# Patient Record
Sex: Female | Born: 1950 | Race: White | Hispanic: No | Marital: Married | Smoking: Never smoker
Health system: Southern US, Community
[De-identification: ages and names within clinical notes are randomized; demographics above are authoritative.]

## PROBLEM LIST (undated history)

## (undated) DIAGNOSIS — M109 Gout, unspecified: Secondary | ICD-10-CM

## (undated) DIAGNOSIS — R112 Nausea with vomiting, unspecified: Secondary | ICD-10-CM

## (undated) DIAGNOSIS — I10 Essential (primary) hypertension: Secondary | ICD-10-CM

## (undated) DIAGNOSIS — M1990 Unspecified osteoarthritis, unspecified site: Secondary | ICD-10-CM

## (undated) DIAGNOSIS — Z9889 Other specified postprocedural states: Secondary | ICD-10-CM

## (undated) DIAGNOSIS — D649 Anemia, unspecified: Secondary | ICD-10-CM

## (undated) DIAGNOSIS — Z87442 Personal history of urinary calculi: Secondary | ICD-10-CM

## (undated) HISTORY — PX: APPENDECTOMY: SHX54

## (undated) HISTORY — PX: FINGER SURGERY: SHX640

## (undated) HISTORY — PX: REDUCTION MAMMAPLASTY: SUR839

## (undated) HISTORY — PX: CHOLECYSTECTOMY: SHX55

## (undated) HISTORY — PX: TOTAL KNEE ARTHROPLASTY: SHX125

---

## 2003-01-02 ENCOUNTER — Encounter: Admission: RE | Admit: 2003-01-02 | Discharge: 2003-01-02 | Payer: Self-pay | Admitting: Unknown Physician Specialty

## 2003-01-02 ENCOUNTER — Encounter: Payer: Self-pay | Admitting: Unknown Physician Specialty

## 2004-06-25 ENCOUNTER — Encounter: Admission: RE | Admit: 2004-06-25 | Discharge: 2004-06-25 | Payer: Self-pay | Admitting: Unknown Physician Specialty

## 2004-07-13 ENCOUNTER — Encounter: Admission: RE | Admit: 2004-07-13 | Discharge: 2004-07-13 | Payer: Self-pay | Admitting: Unknown Physician Specialty

## 2005-08-17 ENCOUNTER — Encounter: Admission: RE | Admit: 2005-08-17 | Discharge: 2005-08-17 | Payer: Self-pay | Admitting: Unknown Physician Specialty

## 2005-08-25 ENCOUNTER — Encounter: Admission: RE | Admit: 2005-08-25 | Discharge: 2005-08-25 | Payer: Self-pay | Admitting: Unknown Physician Specialty

## 2006-09-14 ENCOUNTER — Encounter: Admission: RE | Admit: 2006-09-14 | Discharge: 2006-09-14 | Payer: Self-pay | Admitting: *Deleted

## 2007-03-26 ENCOUNTER — Inpatient Hospital Stay (HOSPITAL_COMMUNITY): Admission: RE | Admit: 2007-03-26 | Discharge: 2007-03-29 | Payer: Self-pay | Admitting: Orthopedic Surgery

## 2007-11-08 ENCOUNTER — Encounter: Admission: RE | Admit: 2007-11-08 | Discharge: 2007-11-08 | Payer: Self-pay | Admitting: *Deleted

## 2008-11-10 ENCOUNTER — Encounter: Admission: RE | Admit: 2008-11-10 | Discharge: 2008-11-10 | Payer: Self-pay | Admitting: Unknown Physician Specialty

## 2009-11-16 ENCOUNTER — Encounter: Admission: RE | Admit: 2009-11-16 | Discharge: 2009-11-16 | Payer: Self-pay | Admitting: Family Medicine

## 2010-07-09 ENCOUNTER — Ambulatory Visit: Payer: Self-pay | Admitting: Cardiology

## 2011-01-05 ENCOUNTER — Encounter
Admission: RE | Admit: 2011-01-05 | Discharge: 2011-01-05 | Payer: Self-pay | Source: Home / Self Care | Attending: Family Medicine | Admitting: Family Medicine

## 2011-01-16 ENCOUNTER — Encounter: Payer: Self-pay | Admitting: Unknown Physician Specialty

## 2011-05-13 NOTE — Discharge Summary (Signed)
NAME:  Karen Tucker, HANNIG NO.:  [id number]   MEDICAL RECORD NO.:  [id number]          PATIENT TYPE:  INP   LOCATION:  1507                         FACILITY:  [facility]   PHYSICIAN:  Ollen Gross, M.D.    DATE OF BIRTH:  April 10, 1951   DATE OF ADMISSION:  03/26/2007  DATE OF DISCHARGE:  03/29/2007                               DISCHARGE SUMMARY   ADMITTING DIAGNOSES:  1. 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  2. Osteoarthritis of left knee.  3. Hypertension.  4. History of hypokalemia in the past.  5. History of renal calculi.   DISCHARGE DIAGNOSES:  1. Osteoarthritis right knee, status post right total knee      arthroplasty.  2. Mild postoperative blood loss anemia, did not require transfusion.  3. Postoperative hypokalemia acutely postoperatively, improved.  4. Mild hyponatremia.   The remaining discharge diagnoses are the same as admitting diagnoses.   PROCEDURE:  1. On March 26, 2007 a right total knee.  Surgeon Dr. Lequita Halt.      Assistant Avel Peace PA-C.  Anesthesia general, postop Marcaine      pain pump.   CONSULTATIONS:  None.   BRIEF HISTORY:  The patient is a 60 year old female with end-stage  arthritis of the right knee with progressive worsening pain and  dysfunction that failed nonoperative management and now presents for  total knee arthroplasty.   LABORATORY DATA:  Preop CBC with hemoglobin of 14.2, hematocrit of 41.7,  white cell count 8.0.  Postop hemoglobin down to 11.8, white count went  up to 12.0.  Hemoglobin drifted back down or came down a little bit  further.  Last noted was 10.8 with hematocrit of 30.6.  White count came  back down to a normal level of 10.5.  PT and PTT preop 12.5 and 27  respectively.  INR 0.9.  Serial pro times followed.  Last PT and INR  21.8 and 1.8.  Chem panel on admission all within normal limits with the  exception of elevated total bili of 1.9.  Serial BMPs were followed.  Sodium did  drop from 138 to 136, last noted at 134.  Potassium dropped  from 3.8 to 3.2 and back up to 3.5.  Remaining electrolytes remained  within normal limits.  Preop urinalysis negative.  Blood group type O  positive.   EKG dated March 19, 2007:  Normal sinus rhythm, normal EKG, confirmed  Dr. Dani Gobble.  Two-view chest March 19, 2007:  No active  cardiopulmonary disease.   HOSPITAL COURSE:  The patient was admitted to [facility],  tolerated the procedure well, later transferred to recovery room and  then the orthopedic floor.  Started on PCA and IM and p.o. analgesic for  pain control following surgery.  She had a rough night in the evening of  surgery but was doing a little bit better on the morning of day one.  Had some intermittent nausea up until earlier that morning.  Nausea was  improving.  It was probably coming from t PCA.  We discontinued that and  encouraged p.o.  meds.  She had decent urinary output.  Potassium was  down, so we added some potassium supplements.  Her blood pressure was  running a little high, but she had not taken her blood pressure  medications.  Those were resumed.  We did lower the dose a little bit  due to it being an ACE inhibitor.  We wanted to make sure that she had  excellent urinary output.  She started getting up with PT by day 2.  She  felt much better.  Nausea had improved.  Much improved clinical response  with the patient.  Dressing was changed.  Incision looked good.  Her  output looked excellent.  Sodium was 136.  Potassium started coming back  up.  Continued the potassium supplements.  Fluids and IVs were  discontinued.  She started getting up to ambulate short distances on the  room but by the following day she was doing much better and ambulating  greater than 100 feet.  She had been weaned over to p.o. meds.  Potassium was back up, although sodium was down a little bit.  Medications were changed.  She was switched over from Percocet  to  Vicodin.  She did better with that medication with no more nausea and  was discharged home.   DISCHARGE PLAN:  1. The patient was discharged home on March 29, 2007.  2. Discharge diagnoses:  Please see above.  3. Discharge medications:  Coumadin, Vicodin, Robaxin, Phenergan.  4. Followup in 2 weeks.  5. Activity:  She is weightbearing as tolerated.  Home health PT and      home health nursing per total knee protocol.  6. Disposition:  Home.  7. Diet:  Resume home diet.   CONDITION ON DISCHARGE:  Improved.      Alexzandrew L. Perkins, P.A.C.      Ollen Gross, M.D.  Electronically Signed    ALP/MEDQ  D:  04/27/2007  T:  04/27/2007  Job:  [id number]   cc:   Donzetta Sprung  Fax: [phone]

## 2011-05-13 NOTE — H&P (Signed)
NAME:  Karen Tucker, Karen Tucker NO.:  [id number]   MEDICAL RECORD NO.:  [id number]          PATIENT TYPE:  INP   LOCATION:  NA                           [facility]   PHYSICIAN:  Ollen Gross, M.D.    DATE OF BIRTH:  05/12/1951   DATE OF ADMISSION:  03/26/2007  DATE OF DISCHARGE:                              HISTORY & PHYSICAL   CHIEF COMPLAINT:  Right knee pain.   HISTORY OF PRESENT ILLNESS:  The patient is a 60 year old female with  long-standing history of problems related to her right knee that has  been progressively ongoing for several years now.  She has been treated  in the past by Dr. Chaney Malling with a knee arthroscopy.  She has also had  viscous supplementations.  She is seen in second opinion by Dr. Lequita Halt  and found to have end-stage medial compartment arthritis in the right  knee and also involvement in the patellofemoral compartment.  There is  narrowing on the left but she is not quite bone-on-bone.  It is felt she  has reached a point where she could benefit from undergoing total knee  replacement.  Risks and benefits have been discussed and she elects to  proceed with surgery.   ALLERGIES:  CODEINE CAUSES RASH.   INTOLERANCES:  MORPHINE CAUSES SICKNESS.   CURRENT MEDICATIONS:  Lisinopril 20 mg, Aleve, allopurinol 300 mg,  Relafen 750 mg, and potassium chloride tablets in the form of Micro-K 10  mEq.   PAST MEDICAL HISTORY:  History of hypokalemia currently on  supplementation, hypertension, history of renal calculi and arthritis.   PAST SURGICAL HISTORY:  Appendectomy, cholecystectomy, right finger  surgery, breast reduction surgery.   SOCIAL HISTORY:  Married.  Nonsmoker.  No alcohol.  Two children.  Her  husband Anouk Critzer will be assisting with care after surgery.   FAMILY HISTORY:  Father deceased age 46 with a history of heart disease,  hypertension and stroke.  Mother with a history of arthritis and  multiple myeloma.  Brother  with a history of brain tumor.   REVIEW OF SYSTEMS:  GENERAL:  No fevers, chills, night sweats.  NEURO:  No seizure, syncope, paralysis.  RESPIRATORY:  No shortness of breath,  productive cough or hemoptysis.  CARDIOVASCULAR:  No chest pain, angina,  orthopnea.  GI:  No nausea, vomiting, diarrhea, constipation.  GU:  No  dysuria, hematuria, discharge.  MUSCULOSKELETAL:  Right knee.   PHYSICAL EXAMINATION:  VITAL SIGNS:  Pulse 64, respirations 16, blood  pressure 110/70.  GENERAL:  A 60 year old white female well nourished, well developed,  short statured, no acute distress.  She is alert and oriented,  cooperative, pleasant.  She is accompanied by her husband.  HEENT:  Normocephalic, atraumatic.  Pupils are round and reactive.  Oropharynx clear.  EOMs intact.  NECK:  Supple.  CHEST:  Clear anterior/posterior chest walls.  HEART:  Regular rate and rhythm, no murmur, S1-S2 noted.  ABDOMEN:  Soft, nontender, bowel sounds present.  RECTAL/BREASTS/GENITALIA:  Not done not pertinent to present illness.  EXTREMITIES:  Right knee shows slight  varus malalignment deformity.  Range of motion 5-120, no effusion.  Left knee shows range of motion of  0-125 and tender more medial, mild crepitus, no effusion.   IMPRESSION:  1. Osteoarthritis right knee, end stage, bone-on-bone.  2. Osteoarthritis left knee.  3. Hypertension.  4. History of hypokalemia.  5. History of renal calculi.   PLAN:  The patient will be admitted to [facility] to undergo a  right total knee replacement arthroplasty.  Surgery will be performed by  Dr. Ollen Gross.      Alexzandrew L. Julien Girt, P.A.      Ollen Gross, M.D.  Electronically Signed    ALP/MEDQ  D:  03/25/2007  T:  03/25/2007  Job:  [id number]   cc:   Donzetta Sprung  Fax: [phone]

## 2011-05-13 NOTE — Op Note (Signed)
NAME:  Karen Tucker, PICA NO.:  [id number]   MEDICAL RECORD NO.:  [id number]          PATIENT TYPE:  INP   LOCATION:  0009                         FACILITY:  [facility]   PHYSICIAN:  Ollen Gross, M.D.    DATE OF BIRTH:  Feb 21, 1951   DATE OF PROCEDURE:  03/26/2007  DATE OF DISCHARGE:                               OPERATIVE REPORT   PREOP DIAGNOSIS:  Osteoarthritis right knee.   POSTOP DIAGNOSIS:  Osteoarthritis right knee.   PROCEDURE:  Right total knee arthroplasty.   SURGEON:  Ollen Gross, M.D.   ASSISTANT:  Avel Peace PA-C   ANESTHESIA:  General with postop Marcaine pain pump.   ESTIMATED BLOOD LOSS:  Minimal.   DRAINS:  Hemovac x1.   TOURNIQUET TIME:  41 minutes at 300 mmHg.   COMPLICATIONS:  None.   CONDITION:  Stable to recovery.   BRIEF CLINICAL NOTE:  Ms. Hamric is a 60 year old female end-stage  arthritis of the right knee with progressively worsening pain and  dysfunction.  She has failed nonoperative management including injection  and presents now for total knee arthroplasty.   PROCEDURE IN DETAIL:  After the successful administration of general  anesthetic a tourniquet was placed high on the right thigh and right  lower extremity prepped and draped in the usual sterile fashion.  Extremities were wrapped in Esmarch, the knee flexed, tourniquet  inflated to 300 mmHg.  A midline incision was made with a 10 blade  through subcutaneous tissue to the level of the extensor mechanism.  A  fresh blade was used to make a medial parapatellar arthrotomy.  Soft  tissue over the proximal medial tibia was subperiosteally elevated to  the joint line with the knife into the semimembranosus bursa with a Cobb  elevator.  Soft tissue laterally was elevated with attention being paid  to avoid the patellar tendon on the tibial tubercle.  The patella  subluxed laterally, knee flexed 90 degrees, ACL and PCL removed.  A  drill was used to create a starting hole  and the distal femur canal was  thoroughly irrigated.  A 5 degree right valgus alignment guide is placed  referencing off the posterior condyles, rotations marked and the block  pins removed 10 mm off the distal femur.  Distal femoral resection was  made with an oscillating saw.  A sizing block is placed, a size 3 is  most appropriate.  Rotation is marked off the epicondylar axis.  A size  three cutting block is placed and the anterior, posterior and chamfer  cuts made on the femur.   Tibia subluxed forward and the menisci removed.  Extramedullary tibial  alignment guides placed referencing proximally at the medial aspect of  the tibial tubercle and distally along the second metatarsal axis and  tibial crest.  Blocks pinned to remove 10 mm of the non deficient  lateral side.  Tibial resection is made with an oscillating saw.  A size  2.5 is the most appropriate tibial component and the proximal tibia is  prepared with the modular drill and keel punch for a size  2.5.  Femoral  preparation is completed with the intercondylar cut for the size 3.   A size 2.5 mobile bearing tibial trial, a size 3 posterior stabilized  femoral trial and a 10 mm posterior stabilized rotating platform insert  trial are placed.  With a 10 full extension is achieved with excellent  varus and valgus balance throughout full range of motion.  The patella  is then everted and thickness measured to be 22 mm.  Freehand resection  was taken to 13 mm, 35 template is placed, lug holes were drilled, trial  patella is placed and it tracks normally.  Osteophytes were then removed  off the posterior femur with the trial in place.  All trials were  removed and the cut bone surfaces prepared with pulsatile lavage.  Cement is mixed and once ready for implantation the size 2.5 mobile  bearing tibial tray, size 3 posterior stabilized femur and the 35  patella are cemented in place.  The patella was held the clamp.  The  trial  10-mm inserts were placed and the knee held in full extension and  all extruded cement removed.  Once the cement is fully hardened then the  permanent 10 mm posterior stabilized rotating platform insert is placed  into the tibial tray.  Wounds copiously irrigated with saline solution  and the extensor mechanism closed over a Hemovac drain with interrupted  #1 PDS.  Flexion against gravity is 135 degrees.  Tourniquet is released  for a total time of 41 minutes.  The subcu was closed with interrupted 2-  0 Vicryl and subcuticular with running 4-0 Monocryl.  Catheter for the  Marcaine pain pump is placed and the pump is initiated.  Hemovac is also  placed to suction.  The incision is then cleaned and dried and Steri-  Strips and a bulky sterile dressing applied.  She is then placed into a  knee immobilizer, awakened and transported to recovery in stable  condition.      Ollen Gross, M.D.  Electronically Signed     FA/MEDQ  D:  03/26/2007  T:  03/26/2007  Job:  [id number]

## 2012-01-19 ENCOUNTER — Other Ambulatory Visit: Payer: Self-pay | Admitting: Family Medicine

## 2012-01-19 DIAGNOSIS — Z1231 Encounter for screening mammogram for malignant neoplasm of breast: Secondary | ICD-10-CM

## 2012-01-30 ENCOUNTER — Ambulatory Visit
Admission: RE | Admit: 2012-01-30 | Discharge: 2012-01-30 | Disposition: A | Discharge status: Home / Self Care | Payer: 59 | Source: Ambulatory Visit | Attending: Family Medicine | Admitting: Family Medicine

## 2012-01-30 DIAGNOSIS — Z1231 Encounter for screening mammogram for malignant neoplasm of breast: Secondary | ICD-10-CM

## 2013-03-25 ENCOUNTER — Other Ambulatory Visit: Payer: Self-pay

## 2013-03-25 DIAGNOSIS — Z1231 Encounter for screening mammogram for malignant neoplasm of breast: Secondary | ICD-10-CM

## 2013-04-18 ENCOUNTER — Ambulatory Visit
Admission: RE | Admit: 2013-04-18 | Discharge: 2013-04-18 | Disposition: A | Discharge status: Home / Self Care | Payer: 59 | Source: Ambulatory Visit

## 2013-04-18 DIAGNOSIS — Z1231 Encounter for screening mammogram for malignant neoplasm of breast: Secondary | ICD-10-CM

## 2014-01-22 ENCOUNTER — Encounter (HOSPITAL_COMMUNITY): Payer: Self-pay | Admitting: Pharmacy Technician

## 2014-01-24 NOTE — Patient Instructions (Addendum)
Karen Tucker  01/24/2014   Your procedure is scheduled on:   01/29/2014  Report to [facility] at  11:30  AM.  Call this number if you have problems the morning of surgery: [phone]   Remember:   Do not eat food or drink liquids after midnight.   Take these medicines the morning of surgery with A SIP OF WATER: Lisinopril   Do not wear jewelry, make-up or nail polish.  Do not wear lotions, powders, or perfumes.   Do not shave 48 hours prior to surgery. Men may shave face and neck.  Do not bring valuables to the hospital.  [facility] is not responsible for any belongings or valuables.               Contacts, dentures or bridgework may not be worn into surgery.  For patients admitted to the hospital, discharge time is determined by your treatment team.               Patients discharged the day of surgery will not be allowed to drive home.  Special Instructions: N/A     Please read over the following fact sheets that you were given: Pain Booklet, Coughing and Deep Breathing, Surgical Site Infection Prevention, Anesthesia Post-op Instructions and Care and Recovery After Surgery   Lithotripsy for Kidney Stones Lithotripsy is a treatment that can sometimes help eliminate kidney stones and pain that they cause. A form of lithotripsy, also known as extracorporeal shock wave lithotripsy, is a nonsurgical procedure that helps your body rid itself of the kidney stone when it is too big to pass on its own. Extracorporeal shock wave lithotripsy is a method of crushing a kidney stone with shock waves. These shock waves pass through your body and are focused on your stone. They cause the kidney stones to crumble while still in the urinary tract. It is then easier for the smaller pieces of stone to pass in the urine. Lithotripsy usually takes about an hour. It is done in a hospital, a lithotripsy center, or a mobile unit. It usually does not require an overnight stay. Your health care provider  will instruct you on preparation for the procedure. Your health care provider will tell you what to expect afterward. LET [facility] CARE PROVIDER KNOW ABOUT:  Any allergies you have.  All medicines you are taking, including vitamins, herbs, eye drops, creams, and over-the-counter medicines.  Previous problems you or members of your family have had with the use of anesthetics.  Any blood disorders you have.  Previous surgeries you have had.  Medical conditions you have. RISKS AND COMPLICATIONS Generally, lithotripsy for kidney stones is a safe procedure. However, as with any procedure, complications can occur. Possible complications include:  Infection.  Bleeding of the kidney.  Bruising of the kidney or skin.  Obstruction of the ureter.  Failure of the stone to fragment. BEFORE THE PROCEDURE  Do not eat or drink for 6 8 hours prior to the procedure. You may, however, take the medications with a sip of water that your physician instructs you to take  Do not take aspirin or aspirin-containing products for 7 days prior to your procedure  Do not take nonsteroidal anti-inflammatory products for 7 days prior to your procedure PROCEDURE A stent (flexible tube with holes) may be placed in your ureter. The ureter is the tube that transports the urine from the kidneys to the bladder. Your health care provider may place  a stent before the procedure. This will help keep urine flowing from the kidney if the fragments of the stone block the ureter. You may have an IV tube placed in one of your veins to give you fluids and medicines. These medicines may help you relax or make you sleep. During the procedure, you will lie comfortably on a fluid-filled cushion or in a warm-water bath. After an X-ray or ultrasound exam to locate your stone, shock waves are aimed at the stone. If you are awake, you may feel a tapping sensation as the shock waves pass through your body. If large stone particles  remain after treatment, a second procedure may be necessary at a later date. For comfort during the test:  Relax as much as possible.  Try to remain still as much as possible.  Try to follow instructions to speed up the test.  Let your health care provider know if you are uncomfortable, anxious, or in pain. AFTER THE PROCEDURE  After surgery, you will be taken to the recovery area. A nurse will watch and check your progress. Once you're awake, stable, and taking fluids well, you will be allowed to go home as long as there are no problems. You will also be allowed to pass your urine before discharge.You may be given antibiotics to help prevent infection. You may also be prescribed pain medicine if needed. In a week or two, your health care provider may remove your stent, if you have one. You may first have an X-ray exam to check on how successful the fragmentation of your stone has been and how much of the stone has passed. Your health care provider will check to see whether or not stone particles remain. SEEK IMMEDIATE MEDICAL CARE IF:  You develop a fever or shaking chills.  Your pain is not relieved by medicine.  You feel sick to your stomach (nauseated) and you vomit.  You develop heavy bleeding.  You have difficulty urinating.  You start to pass your stent from your penis. Document Released: 12/09/2000 Document Revised: 10/02/2013 Document Reviewed: 06/27/2013 [facility] Patient Information 2014 [place]. PATIENT INSTRUCTIONS POST-ANESTHESIA  IMMEDIATELY FOLLOWING SURGERY:  Do not drive or operate machinery for the first twenty four hours after surgery.  Do not make any important decisions for twenty four hours after surgery or while taking narcotic pain medications or sedatives.  If you develop intractable nausea and vomiting or a severe headache please notify your doctor immediately.  FOLLOW-UP:  Please make an appointment with your surgeon as instructed. You do not need  to follow up with anesthesia unless specifically instructed to do so.  WOUND CARE INSTRUCTIONS (if applicable):  Keep a dry clean dressing on the anesthesia/puncture wound site if there is drainage.  Once the wound has quit draining you may leave it open to air.  Generally you should leave the bandage intact for twenty four hours unless there is drainage.  If the epidural site drains for more than 36-48 hours please call the anesthesia department.  QUESTIONS?:  Please feel free to call your physician or the hospital operator if you have any questions, and they will be happy to assist you.

## 2014-01-27 ENCOUNTER — Encounter (HOSPITAL_COMMUNITY): Payer: Self-pay

## 2014-01-27 ENCOUNTER — Encounter (HOSPITAL_COMMUNITY)
Admission: RE | Admit: 2014-01-27 | Discharge: 2014-01-27 | Disposition: A | Discharge status: Home / Self Care | Payer: 59 | Source: Ambulatory Visit | Attending: Urology | Admitting: Urology

## 2014-01-27 HISTORY — DX: Gout, unspecified: M10.9

## 2014-01-27 HISTORY — DX: Essential (primary) hypertension: I10

## 2014-01-27 LAB — BASIC METABOLIC PANEL
BUN: 15 mg/dL (ref 6–23)
CALCIUM: 9.6 mg/dL (ref 8.4–10.5)
CO2: 27 mEq/L (ref 19–32)
Chloride: 101 mEq/L (ref 96–112)
Creatinine, Ser: 0.72 mg/dL (ref 0.50–1.10)
GFR calc Af Amer: >90 mL/min (ref >90)
GFR, EST NON AFRICAN AMERICAN: 89 mL/min — AB (ref >90)
GLUCOSE: 100 mg/dL — AB (ref 70–99)
POTASSIUM: 3.9 meq/L (ref 3.7–5.3)
Sodium: 142 mEq/L (ref 137–147)

## 2014-01-28 NOTE — H&P (Signed)
Urology Admission H&P  Chief Complaint:lt flank pain; CT showed no obstruction,no obstruction; 7-8 MM size stone left kidney; History of kidney stones in the past and has had stone basket extraction. CT also shows colitis on the Left side; she has C-diff positive stools. She was treated with Flagyl and sent home. The pain has subsided. I have advised her to undergo ESL-left kidney stone because stone is quite big and probably will not be Able to pass on her own. Procedure,complications and possible additional procedures have been discussed with patient.  History of Present Illness: Left flank pain; most likely coming from colitis. Has large stone left kidney.  Past Medical History  Diagnosis Date  . Gout   . Hypertension    Past Surgical History  Procedure Laterality Date  . Cholecystectomy    . Appendectomy    . Total knee arthroplasty Right   . Finger surgery Left     due to major cut    Home Medications:  No prescriptions prior to admission   Allergies:  Allergies  Allergen Reactions  . Codeine Nausea And Vomiting  . Morphine And Related Nausea And Vomiting    No family history on file. Social History:  reports that she has never smoked. She does not have any smokeless tobacco history on file. She reports that she does not drink alcohol or use illicit drugs.  Review of Systems  Genitourinary: Positive for flank pain.    Physical Exam:  Vital signs in last 24 hours:   Physical Exam  Constitutional: She appears well-developed.  Cardiovascular: Normal rate.   GI: There is tenderness.  Musculoskeletal: Normal range of motion.    Laboratory Data:  No results found for this or any previous visit (from the past 24 hour(s)). No results found for this or any previous visit (from the past 240 hour(s)). Creatinine:  Recent Labs  01/27/14 1240  CREATININE 0.72   Baseline Creatinine: 0.72-performed 12/27/13  Impression/Assessment:  Left renal  calculus,large.  Plan:  ESL lt renal calculus.  Karen Tucker,MOHAMMAD I 01/28/2014, 3:28 PM

## 2014-01-29 ENCOUNTER — Ambulatory Visit (HOSPITAL_COMMUNITY): Payer: 59

## 2014-01-29 ENCOUNTER — Ambulatory Visit (HOSPITAL_COMMUNITY)
Admission: RE | Admit: 2014-01-29 | Discharge: 2014-01-29 | Disposition: A | Discharge status: Home / Self Care | Payer: 59 | Source: Ambulatory Visit | Attending: Urology | Admitting: Urology

## 2014-01-29 ENCOUNTER — Encounter (HOSPITAL_COMMUNITY): Payer: Self-pay | Admitting: *Deleted

## 2014-01-29 ENCOUNTER — Encounter (HOSPITAL_COMMUNITY)
Admission: RE | Disposition: A | Discharge status: Home / Self Care | Payer: Self-pay | Source: Ambulatory Visit | Attending: Urology

## 2014-01-29 DIAGNOSIS — Z01810 Encounter for preprocedural cardiovascular examination: Secondary | ICD-10-CM | POA: Insufficient documentation

## 2014-01-29 DIAGNOSIS — N200 Calculus of kidney: Secondary | ICD-10-CM | POA: Insufficient documentation

## 2014-01-29 DIAGNOSIS — I10 Essential (primary) hypertension: Secondary | ICD-10-CM | POA: Insufficient documentation

## 2014-01-29 HISTORY — PX: EXTRACORPOREAL SHOCK WAVE LITHOTRIPSY: SHX1557

## 2014-01-29 SURGERY — LITHOTRIPSY, ESWL
Anesthesia: Moderate Sedation | Laterality: Left

## 2014-01-29 MED ORDER — DIPHENHYDRAMINE HCL 25 MG PO CAPS
ORAL_CAPSULE | ORAL | Status: AC
  Filled 2014-01-29: qty 1

## 2014-01-29 MED ORDER — DIAZEPAM 5 MG PO TABS
ORAL_TABLET | ORAL | Status: AC
  Filled 2014-01-29: qty 1

## 2014-01-29 MED ORDER — DIAZEPAM 5 MG PO TABS
10.0000 mg | ORAL_TABLET | Freq: Once | ORAL | Status: AC
  Administered 2014-01-29: 10 mg via ORAL

## 2014-01-29 MED ORDER — DIPHENHYDRAMINE HCL 25 MG PO CAPS
25.0000 mg | ORAL_CAPSULE | Freq: Once | ORAL | Status: AC
  Administered 2014-01-29: 25 mg via ORAL

## 2014-01-29 MED ORDER — SODIUM CHLORIDE 0.9 % IV SOLN
INTRAVENOUS | Status: DC
  Administered 2014-01-29: 12:00:00 via INTRAVENOUS

## 2014-01-29 SURGICAL SUPPLY — 3 items
CLOTH BEACON ORANGE TIMEOUT ST (SAFETY) IMPLANT
GOWN STRL REUS W/TWL LRG LVL3 (GOWN DISPOSABLE) IMPLANT
TOWEL OR 17X26 4PK STRL BLUE (TOWEL DISPOSABLE) IMPLANT

## 2014-01-29 NOTE — Discharge Instructions (Signed)
Lithotripsy, Care After °Refer to this sheet in the next few weeks. These instructions provide you with information on caring for yourself after your procedure. Your health care provider may also give you more specific instructions. Your treatment has been planned according to current medical practices, but problems sometimes occur. Call your health care provider if you have any problems or questions after your procedure. °WHAT TO EXPECT AFTER THE PROCEDURE  °· Your urine may have a red tinge for a few days after treatment. Blood loss is usually minimal. °· You may have soreness in the back or flank area. This usually goes away after a few days. The procedure can cause blotches or bruises on the back where the pressure wave enters the skin. These marks usually cause only minimal discomfort and should disappear in a short time. °· Stone fragments should begin to pass within 24 hours of treatment. However, a delayed passage is not unusual. °· You may have pain, discomfort, and feel sick to your stomach (nauseated) when the crushed fragments of stone are passed down the tube from the kidney to the bladder. Stone fragments can pass soon after the procedure and may last for up to 4 8 weeks. °· A small number of patients may have severe pain when stone fragments are not able to pass, which leads to an obstruction. °· If your stone is greater than 1 inch (2.5 cm) in diameter or if you have multiple stones that have a combined diameter greater than 1 inch (2.5 cm), you may require more than one treatment. °· If you had a stent placed prior to your procedure, you may experience some discomfort, especially during urination. You may experience the pain or discomfort in your flank or back, or you may experience a sharp pain or discomfort at the base of your penis or in your lower abdomen. The discomfort usually lasts only a few minutes after urinating. °HOME CARE INSTRUCTIONS  °· Rest at home until you feel your energy  improving. °· Only take over-the-counter or prescription medicines for pain, discomfort, or fever as directed by your health care provider. Depending on the type of lithotripsy, you may need to take antibiotics and anti-inflammatory medicines for a few days. °· Drink enough water and fluids to keep your urine clear or pale yellow. This helps "flush" your kidneys. It helps pass any remaining pieces of stone and prevents stones from coming back. °· Most people can resume daily activities within 1 2 days after standard lithotripsy. It can take longer to recover from laser and percutaneous lithotripsy. °· If the stones are in your urinary system, you may be asked to strain your urine at home to look for stones. Any stones that are found can be sent to a medical lab for examination. °· Visit your health care provider for a follow-up appointment in a few weeks. Your doctor may remove your stent if you have one. Your health care provider will also check to see whether stone particles still remain. °SEEK MEDICAL CARE IF:  °· Your pain is not relieved by medicine. °· You have a lasting nauseous feeling. °· You feel there is too much blood in the urine. °· You develop persistent problems with frequent or painful urination that does not at least partially improve after 2 days following the procedure. °· You have a congested cough. °· You feel lightheaded. °· You develop a rash or any other signs that might suggest an allergic problem. °· You develop any reaction or side   effects to your medicine(s). °SEEK IMMEDIATE MEDICAL CARE IF:  °· You experience severe back or flank pain or both. °· You see nothing but blood when you urinate. °· You cannot pass any urine at all. °· You have a fever or shaking chills. °· You develop shortness of breath, difficulty breathing, or chest pain. °· You develop vomiting that will not stop after 6 8 hours. °· You have a fainting episode. °Document Released: 01/01/2008 Document Revised: 10/02/2013  Document Reviewed: 06/27/2013 °ExitCare® Patient Information ©2014 [employer]. ° °

## 2014-01-30 ENCOUNTER — Encounter (HOSPITAL_COMMUNITY): Payer: Self-pay | Admitting: Urology

## 2014-01-30 MED ORDER — SIMETHICONE 40 MG/0.6ML PO SUSP
ORAL | Status: AC
  Filled 2014-01-30: qty 1.8

## 2014-02-03 MED FILL — Ondansetron HCl Inj 4 MG/2ML (2 MG/ML): INTRAMUSCULAR | Qty: 2 | Status: AC

## 2014-02-03 MED FILL — Midazolam HCl Inj 2 MG/2ML (Base Equivalent): INTRAMUSCULAR | Qty: 6 | Status: AC

## 2014-02-03 MED FILL — Fentanyl Citrate Inj 0.05 MG/ML: INTRAMUSCULAR | Qty: 6 | Status: AC

## 2014-02-05 ENCOUNTER — Ambulatory Visit (HOSPITAL_COMMUNITY)
Admission: RE | Admit: 2014-02-05 | Discharge: 2014-02-05 | Disposition: A | Discharge status: Home / Self Care | Payer: 59 | Source: Ambulatory Visit | Attending: Urology | Admitting: Urology

## 2014-02-05 ENCOUNTER — Other Ambulatory Visit (HOSPITAL_COMMUNITY): Payer: Self-pay | Admitting: Urology

## 2014-02-05 DIAGNOSIS — M47817 Spondylosis without myelopathy or radiculopathy, lumbosacral region: Secondary | ICD-10-CM | POA: Insufficient documentation

## 2014-02-05 DIAGNOSIS — N200 Calculus of kidney: Secondary | ICD-10-CM

## 2014-04-02 ENCOUNTER — Other Ambulatory Visit: Payer: Self-pay

## 2014-04-02 DIAGNOSIS — Z1231 Encounter for screening mammogram for malignant neoplasm of breast: Secondary | ICD-10-CM

## 2014-04-21 ENCOUNTER — Ambulatory Visit
Admission: RE | Admit: 2014-04-21 | Discharge: 2014-04-21 | Disposition: A | Discharge status: Home / Self Care | Payer: 59 | Source: Ambulatory Visit

## 2014-04-21 DIAGNOSIS — Z1231 Encounter for screening mammogram for malignant neoplasm of breast: Secondary | ICD-10-CM

## 2014-08-08 DIAGNOSIS — I10 Essential (primary) hypertension: Secondary | ICD-10-CM | POA: Diagnosis not present

## 2014-08-08 DIAGNOSIS — E782 Mixed hyperlipidemia: Secondary | ICD-10-CM | POA: Diagnosis not present

## 2014-08-08 DIAGNOSIS — E876 Hypokalemia: Secondary | ICD-10-CM | POA: Diagnosis not present

## 2014-08-08 DIAGNOSIS — IMO0001 Reserved for inherently not codable concepts without codable children: Secondary | ICD-10-CM | POA: Diagnosis not present

## 2014-08-08 DIAGNOSIS — K7689 Other specified diseases of liver: Secondary | ICD-10-CM | POA: Diagnosis not present

## 2014-08-08 DIAGNOSIS — R7309 Other abnormal glucose: Secondary | ICD-10-CM | POA: Diagnosis not present

## 2014-08-18 DIAGNOSIS — R7309 Other abnormal glucose: Secondary | ICD-10-CM | POA: Diagnosis not present

## 2014-08-18 DIAGNOSIS — E669 Obesity, unspecified: Secondary | ICD-10-CM | POA: Diagnosis not present

## 2014-08-18 DIAGNOSIS — E782 Mixed hyperlipidemia: Secondary | ICD-10-CM | POA: Diagnosis not present

## 2014-08-18 DIAGNOSIS — Z23 Encounter for immunization: Secondary | ICD-10-CM | POA: Diagnosis not present

## 2014-08-18 DIAGNOSIS — M1990 Unspecified osteoarthritis, unspecified site: Secondary | ICD-10-CM | POA: Diagnosis not present

## 2014-08-18 DIAGNOSIS — M109 Gout, unspecified: Secondary | ICD-10-CM | POA: Diagnosis not present

## 2014-08-18 DIAGNOSIS — E876 Hypokalemia: Secondary | ICD-10-CM | POA: Diagnosis not present

## 2014-08-18 DIAGNOSIS — I10 Essential (primary) hypertension: Secondary | ICD-10-CM | POA: Diagnosis not present

## 2014-09-05 DIAGNOSIS — K5730 Diverticulosis of large intestine without perforation or abscess without bleeding: Secondary | ICD-10-CM | POA: Diagnosis not present

## 2014-09-05 DIAGNOSIS — K529 Noninfective gastroenteritis and colitis, unspecified: Secondary | ICD-10-CM | POA: Diagnosis not present

## 2014-09-05 DIAGNOSIS — Z86010 Personal history of colonic polyps: Secondary | ICD-10-CM | POA: Diagnosis not present

## 2014-09-08 DIAGNOSIS — Z86010 Personal history of colonic polyps: Secondary | ICD-10-CM | POA: Diagnosis not present

## 2014-09-08 DIAGNOSIS — Z1211 Encounter for screening for malignant neoplasm of colon: Secondary | ICD-10-CM | POA: Diagnosis not present

## 2014-09-08 DIAGNOSIS — K5730 Diverticulosis of large intestine without perforation or abscess without bleeding: Secondary | ICD-10-CM | POA: Diagnosis not present

## 2014-09-08 DIAGNOSIS — R197 Diarrhea, unspecified: Secondary | ICD-10-CM | POA: Diagnosis not present

## 2014-09-08 DIAGNOSIS — Z09 Encounter for follow-up examination after completed treatment for conditions other than malignant neoplasm: Secondary | ICD-10-CM | POA: Diagnosis not present

## 2014-09-08 DIAGNOSIS — D126 Benign neoplasm of colon, unspecified: Secondary | ICD-10-CM | POA: Diagnosis not present

## 2014-10-28 DIAGNOSIS — H52223 Regular astigmatism, bilateral: Secondary | ICD-10-CM | POA: Diagnosis not present

## 2014-10-28 DIAGNOSIS — H5203 Hypermetropia, bilateral: Secondary | ICD-10-CM | POA: Diagnosis not present

## 2014-10-28 DIAGNOSIS — H2513 Age-related nuclear cataract, bilateral: Secondary | ICD-10-CM | POA: Diagnosis not present

## 2014-10-28 DIAGNOSIS — H3530 Unspecified macular degeneration: Secondary | ICD-10-CM | POA: Diagnosis not present

## 2015-01-03 DIAGNOSIS — R109 Unspecified abdominal pain: Secondary | ICD-10-CM | POA: Diagnosis not present

## 2015-01-03 DIAGNOSIS — Z87442 Personal history of urinary calculi: Secondary | ICD-10-CM | POA: Diagnosis not present

## 2015-01-03 DIAGNOSIS — Z79899 Other long term (current) drug therapy: Secondary | ICD-10-CM | POA: Diagnosis not present

## 2015-01-03 DIAGNOSIS — R1032 Left lower quadrant pain: Secondary | ICD-10-CM | POA: Diagnosis not present

## 2015-01-03 DIAGNOSIS — Z888 Allergy status to other drugs, medicaments and biological substances status: Secondary | ICD-10-CM | POA: Diagnosis not present

## 2015-01-03 DIAGNOSIS — I10 Essential (primary) hypertension: Secondary | ICD-10-CM | POA: Diagnosis not present

## 2015-01-03 DIAGNOSIS — N132 Hydronephrosis with renal and ureteral calculous obstruction: Secondary | ICD-10-CM | POA: Diagnosis not present

## 2015-01-03 DIAGNOSIS — N202 Calculus of kidney with calculus of ureter: Secondary | ICD-10-CM | POA: Diagnosis not present

## 2015-01-03 DIAGNOSIS — Z8249 Family history of ischemic heart disease and other diseases of the circulatory system: Secondary | ICD-10-CM | POA: Diagnosis not present

## 2015-01-03 DIAGNOSIS — N200 Calculus of kidney: Secondary | ICD-10-CM | POA: Diagnosis not present

## 2015-01-08 DIAGNOSIS — R05 Cough: Secondary | ICD-10-CM | POA: Diagnosis not present

## 2015-01-08 DIAGNOSIS — N200 Calculus of kidney: Secondary | ICD-10-CM | POA: Diagnosis not present

## 2015-02-24 IMAGING — CR DG ABDOMEN 1V
1 series · 1 of 1 positions shown · non-contrast
Comparison: 01/02/2014

CLINICAL DATA: Renal calculi

EXAM:
ABDOMEN - 1 VIEW

[view not recorded]
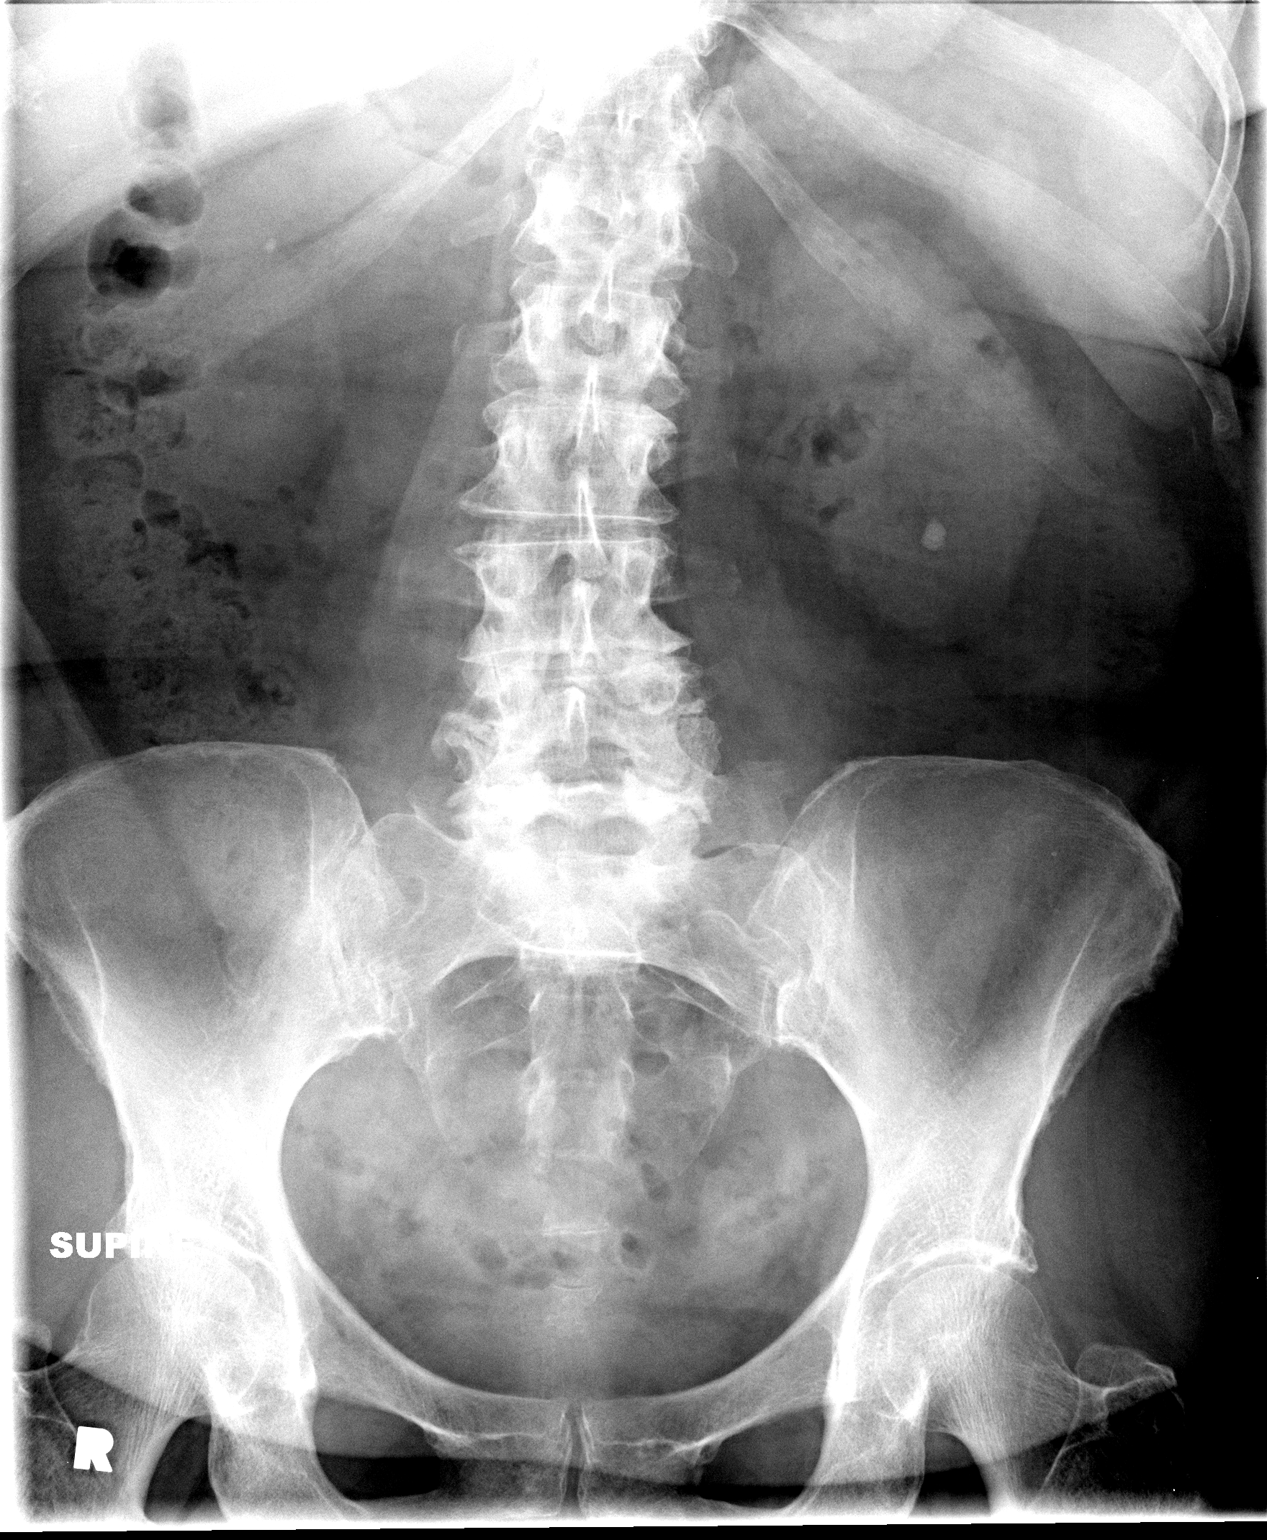

[1 of 1 positions shown; findings below may reference images not displayed]

FINDINGS: Bilateral renal calculi are again identified and stable. No
definitive ureteral stones are seen. Degenerative change of the
lumbar spine is again noted.
IMPRESSION: Stable bilateral renal calculi.

## 2015-03-02 DIAGNOSIS — M791 Myalgia: Secondary | ICD-10-CM | POA: Diagnosis not present

## 2015-03-02 DIAGNOSIS — E8881 Metabolic syndrome: Secondary | ICD-10-CM | POA: Diagnosis not present

## 2015-03-02 DIAGNOSIS — E6609 Other obesity due to excess calories: Secondary | ICD-10-CM | POA: Diagnosis not present

## 2015-03-04 DIAGNOSIS — M1712 Unilateral primary osteoarthritis, left knee: Secondary | ICD-10-CM | POA: Diagnosis not present

## 2015-03-30 DIAGNOSIS — I10 Essential (primary) hypertension: Secondary | ICD-10-CM | POA: Diagnosis not present

## 2015-03-30 DIAGNOSIS — E8881 Metabolic syndrome: Secondary | ICD-10-CM | POA: Diagnosis not present

## 2015-03-30 DIAGNOSIS — K7581 Nonalcoholic steatohepatitis (NASH): Secondary | ICD-10-CM | POA: Diagnosis not present

## 2015-03-30 DIAGNOSIS — M170 Bilateral primary osteoarthritis of knee: Secondary | ICD-10-CM | POA: Diagnosis not present

## 2015-03-30 DIAGNOSIS — E782 Mixed hyperlipidemia: Secondary | ICD-10-CM | POA: Diagnosis not present

## 2015-03-30 DIAGNOSIS — Z9189 Other specified personal risk factors, not elsewhere classified: Secondary | ICD-10-CM | POA: Diagnosis not present

## 2015-03-30 DIAGNOSIS — E6609 Other obesity due to excess calories: Secondary | ICD-10-CM | POA: Diagnosis not present

## 2015-03-30 DIAGNOSIS — M1000 Idiopathic gout, unspecified site: Secondary | ICD-10-CM | POA: Diagnosis not present

## 2015-03-30 DIAGNOSIS — E876 Hypokalemia: Secondary | ICD-10-CM | POA: Diagnosis not present

## 2015-03-30 DIAGNOSIS — Z0001 Encounter for general adult medical examination with abnormal findings: Secondary | ICD-10-CM | POA: Diagnosis not present

## 2015-03-30 DIAGNOSIS — Z1389 Encounter for screening for other disorder: Secondary | ICD-10-CM | POA: Diagnosis not present

## 2015-07-27 ENCOUNTER — Other Ambulatory Visit: Payer: Self-pay

## 2015-07-27 DIAGNOSIS — Z1231 Encounter for screening mammogram for malignant neoplasm of breast: Secondary | ICD-10-CM

## 2015-08-06 ENCOUNTER — Ambulatory Visit
Admission: RE | Admit: 2015-08-06 | Discharge: 2015-08-06 | Disposition: A | Discharge status: Home / Self Care | Payer: Medicare Other | Source: Ambulatory Visit

## 2015-08-06 DIAGNOSIS — Z1231 Encounter for screening mammogram for malignant neoplasm of breast: Secondary | ICD-10-CM

## 2015-09-07 DIAGNOSIS — R5383 Other fatigue: Secondary | ICD-10-CM | POA: Diagnosis not present

## 2015-09-07 DIAGNOSIS — M25511 Pain in right shoulder: Secondary | ICD-10-CM | POA: Diagnosis not present

## 2015-11-04 DIAGNOSIS — E782 Mixed hyperlipidemia: Secondary | ICD-10-CM | POA: Diagnosis not present

## 2015-11-04 DIAGNOSIS — K7581 Nonalcoholic steatohepatitis (NASH): Secondary | ICD-10-CM | POA: Diagnosis not present

## 2015-11-04 DIAGNOSIS — M170 Bilateral primary osteoarthritis of knee: Secondary | ICD-10-CM | POA: Diagnosis not present

## 2015-11-04 DIAGNOSIS — E876 Hypokalemia: Secondary | ICD-10-CM | POA: Diagnosis not present

## 2015-11-04 DIAGNOSIS — E8881 Metabolic syndrome: Secondary | ICD-10-CM | POA: Diagnosis not present

## 2015-11-04 DIAGNOSIS — Z23 Encounter for immunization: Secondary | ICD-10-CM | POA: Diagnosis not present

## 2015-11-04 DIAGNOSIS — I10 Essential (primary) hypertension: Secondary | ICD-10-CM | POA: Diagnosis not present

## 2015-11-04 DIAGNOSIS — E6609 Other obesity due to excess calories: Secondary | ICD-10-CM | POA: Diagnosis not present

## 2015-11-18 DIAGNOSIS — Z6834 Body mass index (BMI) 34.0-34.9, adult: Secondary | ICD-10-CM | POA: Diagnosis not present

## 2015-11-18 DIAGNOSIS — Z01419 Encounter for gynecological examination (general) (routine) without abnormal findings: Secondary | ICD-10-CM | POA: Diagnosis not present

## 2015-12-30 DIAGNOSIS — H2513 Age-related nuclear cataract, bilateral: Secondary | ICD-10-CM | POA: Diagnosis not present

## 2015-12-30 DIAGNOSIS — H353131 Nonexudative age-related macular degeneration, bilateral, early dry stage: Secondary | ICD-10-CM | POA: Diagnosis not present

## 2015-12-30 DIAGNOSIS — H5203 Hypermetropia, bilateral: Secondary | ICD-10-CM | POA: Diagnosis not present

## 2015-12-30 DIAGNOSIS — H35373 Puckering of macula, bilateral: Secondary | ICD-10-CM | POA: Diagnosis not present

## 2016-03-08 DIAGNOSIS — M25562 Pain in left knee: Secondary | ICD-10-CM | POA: Diagnosis not present

## 2016-03-08 DIAGNOSIS — M1712 Unilateral primary osteoarthritis, left knee: Secondary | ICD-10-CM | POA: Diagnosis not present

## 2016-04-21 DIAGNOSIS — S63592A Other specified sprain of left wrist, initial encounter: Secondary | ICD-10-CM | POA: Diagnosis not present

## 2016-04-21 DIAGNOSIS — M170 Bilateral primary osteoarthritis of knee: Secondary | ICD-10-CM | POA: Diagnosis not present

## 2016-04-22 DIAGNOSIS — E782 Mixed hyperlipidemia: Secondary | ICD-10-CM | POA: Diagnosis not present

## 2016-04-22 DIAGNOSIS — K7581 Nonalcoholic steatohepatitis (NASH): Secondary | ICD-10-CM | POA: Diagnosis not present

## 2016-04-22 DIAGNOSIS — I10 Essential (primary) hypertension: Secondary | ICD-10-CM | POA: Diagnosis not present

## 2016-04-26 DIAGNOSIS — R7301 Impaired fasting glucose: Secondary | ICD-10-CM | POA: Diagnosis not present

## 2016-04-26 DIAGNOSIS — E876 Hypokalemia: Secondary | ICD-10-CM | POA: Diagnosis not present

## 2016-04-26 DIAGNOSIS — I10 Essential (primary) hypertension: Secondary | ICD-10-CM | POA: Diagnosis not present

## 2016-04-26 DIAGNOSIS — E8881 Metabolic syndrome: Secondary | ICD-10-CM | POA: Diagnosis not present

## 2016-04-26 DIAGNOSIS — E782 Mixed hyperlipidemia: Secondary | ICD-10-CM | POA: Diagnosis not present

## 2016-04-26 DIAGNOSIS — E6609 Other obesity due to excess calories: Secondary | ICD-10-CM | POA: Diagnosis not present

## 2016-04-26 DIAGNOSIS — M170 Bilateral primary osteoarthritis of knee: Secondary | ICD-10-CM | POA: Diagnosis not present

## 2016-04-26 DIAGNOSIS — K7581 Nonalcoholic steatohepatitis (NASH): Secondary | ICD-10-CM | POA: Diagnosis not present

## 2016-06-13 DIAGNOSIS — H11153 Pinguecula, bilateral: Secondary | ICD-10-CM | POA: Diagnosis not present

## 2016-06-13 DIAGNOSIS — H353111 Nonexudative age-related macular degeneration, right eye, early dry stage: Secondary | ICD-10-CM | POA: Diagnosis not present

## 2016-06-13 DIAGNOSIS — H35372 Puckering of macula, left eye: Secondary | ICD-10-CM | POA: Diagnosis not present

## 2016-06-13 DIAGNOSIS — H353121 Nonexudative age-related macular degeneration, left eye, early dry stage: Secondary | ICD-10-CM | POA: Diagnosis not present

## 2016-06-13 DIAGNOSIS — H25013 Cortical age-related cataract, bilateral: Secondary | ICD-10-CM | POA: Diagnosis not present

## 2016-06-13 DIAGNOSIS — H5203 Hypermetropia, bilateral: Secondary | ICD-10-CM | POA: Diagnosis not present

## 2016-06-13 DIAGNOSIS — H35371 Puckering of macula, right eye: Secondary | ICD-10-CM | POA: Diagnosis not present

## 2016-06-13 DIAGNOSIS — H11823 Conjunctivochalasis, bilateral: Secondary | ICD-10-CM | POA: Diagnosis not present

## 2016-06-13 DIAGNOSIS — H524 Presbyopia: Secondary | ICD-10-CM | POA: Diagnosis not present

## 2016-06-13 DIAGNOSIS — H2513 Age-related nuclear cataract, bilateral: Secondary | ICD-10-CM | POA: Diagnosis not present

## 2016-07-11 DIAGNOSIS — M1712 Unilateral primary osteoarthritis, left knee: Secondary | ICD-10-CM | POA: Diagnosis not present

## 2016-08-15 ENCOUNTER — Other Ambulatory Visit: Payer: Self-pay | Admitting: Family Medicine

## 2016-08-15 DIAGNOSIS — Z1231 Encounter for screening mammogram for malignant neoplasm of breast: Secondary | ICD-10-CM

## 2016-08-25 ENCOUNTER — Other Ambulatory Visit: Payer: Self-pay

## 2016-09-08 ENCOUNTER — Ambulatory Visit
Admission: RE | Admit: 2016-09-08 | Discharge: 2016-09-08 | Disposition: A | Discharge status: Home / Self Care | Payer: Medicare Other | Source: Ambulatory Visit | Attending: Family Medicine | Admitting: Family Medicine

## 2016-09-08 DIAGNOSIS — Z1231 Encounter for screening mammogram for malignant neoplasm of breast: Secondary | ICD-10-CM

## 2016-09-19 DIAGNOSIS — M19032 Primary osteoarthritis, left wrist: Secondary | ICD-10-CM | POA: Diagnosis not present

## 2016-09-19 DIAGNOSIS — S63592A Other specified sprain of left wrist, initial encounter: Secondary | ICD-10-CM | POA: Diagnosis not present

## 2016-09-19 DIAGNOSIS — M25532 Pain in left wrist: Secondary | ICD-10-CM | POA: Diagnosis not present

## 2016-09-19 DIAGNOSIS — R5383 Other fatigue: Secondary | ICD-10-CM | POA: Diagnosis not present

## 2016-09-19 DIAGNOSIS — Z6833 Body mass index (BMI) 33.0-33.9, adult: Secondary | ICD-10-CM | POA: Diagnosis not present

## 2016-09-19 DIAGNOSIS — E782 Mixed hyperlipidemia: Secondary | ICD-10-CM | POA: Diagnosis not present

## 2016-09-26 DIAGNOSIS — E876 Hypokalemia: Secondary | ICD-10-CM | POA: Diagnosis not present

## 2016-09-26 DIAGNOSIS — E6609 Other obesity due to excess calories: Secondary | ICD-10-CM | POA: Diagnosis not present

## 2016-09-26 DIAGNOSIS — E782 Mixed hyperlipidemia: Secondary | ICD-10-CM | POA: Diagnosis not present

## 2016-09-26 DIAGNOSIS — I10 Essential (primary) hypertension: Secondary | ICD-10-CM | POA: Diagnosis not present

## 2016-09-26 DIAGNOSIS — Z23 Encounter for immunization: Secondary | ICD-10-CM | POA: Diagnosis not present

## 2016-09-26 DIAGNOSIS — E8881 Metabolic syndrome: Secondary | ICD-10-CM | POA: Diagnosis not present

## 2016-09-26 DIAGNOSIS — Z1212 Encounter for screening for malignant neoplasm of rectum: Secondary | ICD-10-CM | POA: Diagnosis not present

## 2016-09-26 DIAGNOSIS — Z6833 Body mass index (BMI) 33.0-33.9, adult: Secondary | ICD-10-CM | POA: Diagnosis not present

## 2016-11-01 DIAGNOSIS — Z6834 Body mass index (BMI) 34.0-34.9, adult: Secondary | ICD-10-CM | POA: Diagnosis not present

## 2016-11-01 DIAGNOSIS — M19032 Primary osteoarthritis, left wrist: Secondary | ICD-10-CM | POA: Diagnosis not present

## 2016-11-01 DIAGNOSIS — M25532 Pain in left wrist: Secondary | ICD-10-CM | POA: Diagnosis not present

## 2016-11-09 DIAGNOSIS — L821 Other seborrheic keratosis: Secondary | ICD-10-CM | POA: Diagnosis not present

## 2016-11-09 DIAGNOSIS — L570 Actinic keratosis: Secondary | ICD-10-CM | POA: Diagnosis not present

## 2016-11-09 DIAGNOSIS — D1800 Hemangioma unspecified site: Secondary | ICD-10-CM | POA: Diagnosis not present

## 2016-11-23 DIAGNOSIS — M1712 Unilateral primary osteoarthritis, left knee: Secondary | ICD-10-CM | POA: Diagnosis not present

## 2017-01-03 DIAGNOSIS — J069 Acute upper respiratory infection, unspecified: Secondary | ICD-10-CM | POA: Diagnosis not present

## 2017-01-03 DIAGNOSIS — R05 Cough: Secondary | ICD-10-CM | POA: Diagnosis not present

## 2017-01-03 DIAGNOSIS — Z6834 Body mass index (BMI) 34.0-34.9, adult: Secondary | ICD-10-CM | POA: Diagnosis not present

## 2017-01-05 DIAGNOSIS — M1712 Unilateral primary osteoarthritis, left knee: Secondary | ICD-10-CM | POA: Diagnosis not present

## 2017-01-06 DIAGNOSIS — M67332 Transient synovitis, left wrist: Secondary | ICD-10-CM | POA: Diagnosis not present

## 2017-01-13 DIAGNOSIS — M1712 Unilateral primary osteoarthritis, left knee: Secondary | ICD-10-CM | POA: Diagnosis not present

## 2017-01-17 DIAGNOSIS — M67332 Transient synovitis, left wrist: Secondary | ICD-10-CM | POA: Diagnosis not present

## 2017-01-19 DIAGNOSIS — M1712 Unilateral primary osteoarthritis, left knee: Secondary | ICD-10-CM | POA: Diagnosis not present

## 2017-01-27 DIAGNOSIS — M67332 Transient synovitis, left wrist: Secondary | ICD-10-CM | POA: Diagnosis not present

## 2017-02-06 DIAGNOSIS — Z23 Encounter for immunization: Secondary | ICD-10-CM | POA: Diagnosis not present

## 2017-02-06 DIAGNOSIS — E782 Mixed hyperlipidemia: Secondary | ICD-10-CM | POA: Diagnosis not present

## 2017-02-06 DIAGNOSIS — E8881 Metabolic syndrome: Secondary | ICD-10-CM | POA: Diagnosis not present

## 2017-02-06 DIAGNOSIS — K7581 Nonalcoholic steatohepatitis (NASH): Secondary | ICD-10-CM | POA: Diagnosis not present

## 2017-02-06 DIAGNOSIS — E876 Hypokalemia: Secondary | ICD-10-CM | POA: Diagnosis not present

## 2017-02-06 DIAGNOSIS — I10 Essential (primary) hypertension: Secondary | ICD-10-CM | POA: Diagnosis not present

## 2017-02-06 DIAGNOSIS — E6609 Other obesity due to excess calories: Secondary | ICD-10-CM | POA: Diagnosis not present

## 2017-02-06 DIAGNOSIS — M170 Bilateral primary osteoarthritis of knee: Secondary | ICD-10-CM | POA: Diagnosis not present

## 2017-02-09 DIAGNOSIS — E8881 Metabolic syndrome: Secondary | ICD-10-CM | POA: Diagnosis not present

## 2017-02-09 DIAGNOSIS — R739 Hyperglycemia, unspecified: Secondary | ICD-10-CM | POA: Diagnosis not present

## 2017-02-09 DIAGNOSIS — K7581 Nonalcoholic steatohepatitis (NASH): Secondary | ICD-10-CM | POA: Diagnosis not present

## 2017-02-09 DIAGNOSIS — I10 Essential (primary) hypertension: Secondary | ICD-10-CM | POA: Diagnosis not present

## 2017-02-09 DIAGNOSIS — E782 Mixed hyperlipidemia: Secondary | ICD-10-CM | POA: Diagnosis not present

## 2017-03-09 DIAGNOSIS — M1712 Unilateral primary osteoarthritis, left knee: Secondary | ICD-10-CM | POA: Diagnosis not present

## 2017-04-06 ENCOUNTER — Ambulatory Visit: Payer: Self-pay | Admitting: Orthopedic Surgery

## 2017-04-10 ENCOUNTER — Other Ambulatory Visit: Payer: Self-pay | Admitting: Orthopedic Surgery

## 2017-04-10 DIAGNOSIS — M1712 Unilateral primary osteoarthritis, left knee: Secondary | ICD-10-CM | POA: Diagnosis not present

## 2017-04-11 ENCOUNTER — Other Ambulatory Visit: Payer: Self-pay | Admitting: Orthopedic Surgery

## 2017-04-27 NOTE — Patient Instructions (Signed)
Karen Tucker  04/27/2017   Your procedure is scheduled on: 05-08-17   Report to [facility] Main  Entrance Report to Admitting at 1115 AM   Call this number if you have problems the morning of surgery [phone]    Remember: ONLY 1 PERSON MAY GO WITH YOU TO SHORT STAY TO GET  READY MORNING OF YOUR SURGERY.  Do not eat food or drink liquids :After Midnight. You may have a clear liquid diet from midnight to until 0745 AM. Then nothing by mouth     CLEAR LIQUID DIET   Foods Allowed                                                                     Foods Excluded  Coffee and tea, regular and decaf                             liquids that you cannot  Plain Jell-O in any flavor                                             see through such as: Fruit ices (not with fruit pulp)                                     milk, soups, orange juice  Iced Popsicles                                    All solid food Carbonated beverages, regular and diet                                    Cranberry, grape and apple juices Sports drinks like Gatorade Lightly seasoned clear broth or consume(fat free) Sugar, honey syrup  Sample Menu Breakfast                                Lunch                                     Supper Cranberry juice                    Beef broth                            Chicken broth Jell-O                                     Grape juice  Apple juice Coffee or tea                        Jell-O                                      Popsicle                                                Coffee or tea                        Coffee or tea  _____________________________________________________________________    Take these medicines the morning of surgery with A SIP OF WATER: Allopurinol (Zyloprim)               You may not have any metal on your body including hair pins and              piercings  Do not wear jewelry, make-up,  lotions, powders or perfumes, deodorant             Do not wear nail polish.  Do not shave  48 hours prior to surgery.                Do not bring valuables to the hospital. [place].  Contacts, dentures or bridgework may not be worn into surgery.  Leave suitcase in the car. After surgery it may be brought to your room.     Please read over the following fact sheets you were given: _____________________________________________________________________  [facility] - Preparing for Surgery Before surgery, you can play an important role.  Because skin is not sterile, your skin needs to be as free of germs as possible.  You can reduce the number of germs on your skin by washing with CHG (chlorahexidine gluconate) soap before surgery.  CHG is an antiseptic cleaner which kills germs and bonds with the skin to continue killing germs even after washing. Please DO NOT use if you have an allergy to CHG or antibacterial soaps.  If your skin becomes reddened/irritated stop using the CHG and inform your nurse when you arrive at Short Stay. Do not shave (including legs and underarms) for at least 48 hours prior to the first CHG shower.  You may shave your face/neck. Please follow these instructions carefully:  1.  Shower with CHG Soap the night before surgery and the  morning of Surgery.  2.  If you choose to wash your hair, wash your hair first as usual with your  normal  shampoo.  3.  After you shampoo, rinse your hair and body thoroughly to remove the  shampoo.                           4.  Use CHG as you would any other liquid soap.  You can apply chg directly  to the skin and wash                       Gently with a scrungie or clean washcloth.  5.  Apply  the CHG Soap to your body ONLY FROM THE NECK DOWN.   Do not use on face/ open                           Wound or open sores. Avoid contact with eyes, ears mouth and genitals (private parts).                        Wash face,  Genitals (private parts) with your normal soap.             6.  Wash thoroughly, paying special attention to the area where your surgery  will be performed.  7.  Thoroughly rinse your body with warm water from the neck down.  8.  DO NOT shower/wash with your normal soap after using and rinsing off  the CHG Soap.                9.  Pat yourself dry with a clean towel.            10.  Wear clean pajamas.            11.  Place clean sheets on your bed the night of your first shower and do not  sleep with pets. Day of Surgery : Do not apply any lotions/deodorants the morning of surgery.  Please wear clean clothes to the hospital/surgery center.  FAILURE TO FOLLOW THESE INSTRUCTIONS MAY RESULT IN THE CANCELLATION OF YOUR SURGERY PATIENT SIGNATURE_________________________________  NURSE SIGNATURE__________________________________  ________________________________________________________________________   Karen Tucker  An incentive spirometer is a tool that can help keep your lungs clear and active. This tool measures how well you are filling your lungs with each breath. Taking long deep breaths may help reverse or decrease the chance of developing breathing (pulmonary) problems (especially infection) following:  A long period of time when you are unable to move or be active. BEFORE THE PROCEDURE   If the spirometer includes an indicator to show your best effort, your nurse or respiratory therapist will set it to a desired goal.  If possible, sit up straight or lean slightly forward. Try not to slouch.  Hold the incentive spirometer in an upright position. INSTRUCTIONS FOR USE  1. Sit on the edge of your bed if possible, or sit up as far as you can in bed or on a chair. 2. Hold the incentive spirometer in an upright position. 3. Breathe out normally. 4. Place the mouthpiece in your mouth and seal your lips tightly around it. 5. Breathe in slowly and as deeply  as possible, raising the piston or the ball toward the top of the column. 6. Hold your breath for 3-5 seconds or for as long as possible. Allow the piston or ball to fall to the bottom of the column. 7. Remove the mouthpiece from your mouth and breathe out normally. 8. Rest for a few seconds and repeat Steps 1 through 7 at least 10 times every 1-2 hours when you are awake. Take your time and take a few normal breaths between deep breaths. 9. The spirometer may include an indicator to show your best effort. Use the indicator as a goal to work toward during each repetition. 10. After each set of 10 deep breaths, practice coughing to be sure your lungs are clear. If you have an incision (the cut made at the time of surgery), support your incision when coughing by placing a pillow or  rolled up towels firmly against it. Once you are able to get out of bed, walk around indoors and cough well. You may stop using the incentive spirometer when instructed by your caregiver.  RISKS AND COMPLICATIONS  Take your time so you do not get dizzy or light-headed.  If you are in pain, you may need to take or ask for pain medication before doing incentive spirometry. It is harder to take a deep breath if you are having pain. AFTER USE  Rest and breathe slowly and easily.  It can be helpful to keep track of a log of your progress. Your caregiver can provide you with a simple table to help with this. If you are using the spirometer at home, follow these instructions: [place] IF:   You are having difficultly using the spirometer.  You have trouble using the spirometer as often as instructed.  Your pain medication is not giving enough relief while using the spirometer.  You develop fever of 100.5 F (38.1 C) or higher. SEEK IMMEDIATE MEDICAL CARE IF:   You cough up bloody sputum that had not been present before.  You develop fever of 102 F (38.9 C) or greater.  You develop worsening pain at or  near the incision site. MAKE SURE YOU:   Understand these instructions.  Will watch your condition.  Will get help right away if you are not doing well or get worse. Document Released: 04/24/2007 Document Revised: 03/05/2012 Document Reviewed: 06/25/2007 ExitCare Patient Information 2014 [employer], [place].   ________________________________________________________________________  WHAT IS A BLOOD TRANSFUSION? Blood Transfusion Information  A transfusion is the replacement of blood or some of its parts. Blood is made up of multiple cells which provide different functions.  Red blood cells carry oxygen and are used for blood loss replacement.  White blood cells fight against infection.  Platelets control bleeding.  Plasma helps clot blood.  Other blood products are available for specialized needs, such as hemophilia or other clotting disorders. BEFORE THE TRANSFUSION  Who gives blood for transfusions?   Healthy volunteers who are fully evaluated to make sure their blood is safe. This is blood bank blood. Transfusion therapy is the safest it has ever been in the practice of medicine. Before blood is taken from a donor, a complete history is taken to make sure that person has no history of diseases nor engages in risky social behavior (examples are intravenous drug use or sexual activity with multiple partners). The donor's travel history is screened to minimize risk of transmitting infections, such as malaria. The donated blood is tested for signs of infectious diseases, such as HIV and hepatitis. The blood is then tested to be sure it is compatible with you in order to minimize the chance of a transfusion reaction. If you or a relative donates blood, this is often done in anticipation of surgery and is not appropriate for emergency situations. It takes many days to process the donated blood. RISKS AND COMPLICATIONS Although transfusion therapy is very safe and saves many lives, the main  dangers of transfusion include:   Getting an infectious disease.  Developing a transfusion reaction. This is an allergic reaction to something in the blood you were given. Every precaution is taken to prevent this. The decision to have a blood transfusion has been considered carefully by your caregiver before blood is given. Blood is not given unless the benefits outweigh the risks. AFTER THE TRANSFUSION  Right after receiving a blood transfusion, you will usually  feel much better and more energetic. This is especially true if your red blood cells have gotten low (anemic). The transfusion raises the level of the red blood cells which carry oxygen, and this usually causes an energy increase.  The nurse administering the transfusion will monitor you carefully for complications. HOME CARE INSTRUCTIONS  No special instructions are needed after a transfusion. You may find your energy is better. Speak with your caregiver about any limitations on activity for underlying diseases you may have. SEEK MEDICAL CARE IF:   Your condition is not improving after your transfusion.  You develop redness or irritation at the intravenous (IV) site. SEEK IMMEDIATE MEDICAL CARE IF:  Any of the following symptoms occur over the next 12 hours:  Shaking chills.  You have a temperature by mouth above 102 F (38.9 C), not controlled by medicine.  Chest, back, or muscle pain.  People around you feel you are not acting correctly or are confused.  Shortness of breath or difficulty breathing.  Dizziness and fainting.  You get a rash or develop hives.  You have a decrease in urine output.  Your urine turns a dark color or changes to pink, red, or brown. Any of the following symptoms occur over the next 10 days:  You have a temperature by mouth above 102 F (38.9 C), not controlled by medicine.  Shortness of breath.  Weakness after normal activity.  The white part of the eye turns yellow  (jaundice).  You have a decrease in the amount of urine or are urinating less often.  Your urine turns a dark color or changes to pink, red, or brown. Document Released: 12/09/2000 Document Revised: 03/05/2012 Document Reviewed: 07/28/2008 [facility] Patient Information 2014 [place], [place].  _______________________________________________________________________

## 2017-04-27 NOTE — Progress Notes (Signed)
02-06-17 Surgical clearance from Dr. Quillian Quince on chart

## 2017-04-28 ENCOUNTER — Encounter (HOSPITAL_COMMUNITY)
Admission: RE | Admit: 2017-04-28 | Discharge: 2017-04-28 | Disposition: A | Discharge status: Home / Self Care | Payer: Medicare Other | Source: Ambulatory Visit | Attending: Orthopedic Surgery | Admitting: Orthopedic Surgery

## 2017-04-28 ENCOUNTER — Encounter (HOSPITAL_COMMUNITY): Payer: Self-pay

## 2017-04-28 DIAGNOSIS — Z01812 Encounter for preprocedural laboratory examination: Secondary | ICD-10-CM | POA: Insufficient documentation

## 2017-04-28 DIAGNOSIS — M1712 Unilateral primary osteoarthritis, left knee: Secondary | ICD-10-CM | POA: Insufficient documentation

## 2017-04-28 DIAGNOSIS — Z0183 Encounter for blood typing: Secondary | ICD-10-CM | POA: Insufficient documentation

## 2017-04-28 HISTORY — DX: Nausea with vomiting, unspecified: R11.2

## 2017-04-28 HISTORY — DX: Other specified postprocedural states: Z98.890

## 2017-04-28 LAB — COMPREHENSIVE METABOLIC PANEL
ALK PHOS: 72 U/L (ref 38–126)
ALT: 19 U/L (ref 14–54)
ANION GAP: 8 (ref 5–15)
AST: 26 U/L (ref 15–41)
Albumin: 4.4 g/dL (ref 3.5–5.0)
BILIRUBIN TOTAL: 1.6 mg/dL — AB (ref 0.3–1.2)
BUN: 13 mg/dL (ref 6–20)
CALCIUM: 9.6 mg/dL (ref 8.9–10.3)
CO2: 27 mmol/L (ref 22–32)
CREATININE: 0.66 mg/dL (ref 0.44–1.00)
Chloride: 106 mmol/L (ref 101–111)
GFR calc non Af Amer: >60 mL/min (ref >60)
GLUCOSE: 119 mg/dL — AB (ref 65–99)
Potassium: 3.5 mmol/L (ref 3.5–5.1)
SODIUM: 141 mmol/L (ref 135–145)
TOTAL PROTEIN: 7.2 g/dL (ref 6.5–8.1)

## 2017-04-28 LAB — SURGICAL PCR SCREEN
MRSA, PCR: NEGATIVE
Staphylococcus aureus: NEGATIVE

## 2017-04-28 LAB — APTT: aPTT: 29 seconds (ref 24–36)

## 2017-04-28 LAB — CBC
HEMATOCRIT: 40.2 % (ref 36.0–46.0)
HEMOGLOBIN: 13.8 g/dL (ref 12.0–15.0)
MCH: 31.2 pg (ref 26.0–34.0)
MCHC: 34.3 g/dL (ref 30.0–36.0)
MCV: 90.7 fL (ref 78.0–100.0)
Platelets: 193 10*3/uL (ref 150–400)
RBC: 4.43 MIL/uL (ref 3.87–5.11)
RDW: 13.5 % (ref 11.5–15.5)
WBC: 7.2 10*3/uL (ref 4.0–10.5)

## 2017-04-28 LAB — PROTIME-INR
INR: 1
Prothrombin Time: 13.2 seconds (ref 11.4–15.2)

## 2017-04-28 NOTE — Progress Notes (Signed)
Consent in EPIC from Dr. Anne Fu office, as well as clearance from Dr. Quillian Quince MD is for a Left total knee arthroplasty for 05/08/17. Pt also verified that she is having the left total knee done. Stated that right knee was previously done.   Left voice mail for Velvet at Dr. Anne Fu office to please correct on the OR schedule to reflect a Left Knee Arthroplasty.

## 2017-05-01 ENCOUNTER — Ambulatory Visit: Payer: Self-pay | Admitting: Orthopedic Surgery

## 2017-05-01 NOTE — H&P (Signed)
Karen Tucker DOB: 1951/01/17 Married / Language: English / Race: White Female Date of Admission:  05/08/2017 CC:  Left Knee Pain History of Present Illness The patient is a 66 year old female who comes in for a preoperative History and Physical. The patient is scheduled for a left total knee arthroplasty to be performed by Dr. Dione Plover. Aluisio, MD at [facility] on 05-08-2017. The patient is a 66 year old female who presented for follow up of their knee. The patient is being followed for their left knee pain and osteoarthritis. They are now months out from Euflexxa series. Symptoms reported include: pain, swelling, aching, stiffness (especially in the mornings), giving way (occasionally) and difficulty ambulating. The patient feels that they are doing poorly. Current treatment includes: icing. The following medication has been used for pain control: Tylenol. The patient has not gotten any relief of their symptoms with viscosupplementation. Unfortunately, her left knee is getting progressively worse. The Euflexxa injections did not provide much benefit. Left knee is actually hurting her as bad or worse than the right it did prior to when she had that replaced about nine years ago. She feels as though the knee is now preventing her from doing more things. The Euflexxa injections unfortunately did not help. She has also had cortisone, which did not provide any lasting benefit. She is now ready to get the left knee replaced at this time. They have been treated conservatively in the past for the above stated problem and despite conservative measures, they continue to have progressive pain and severe functional limitations and dysfunction. They have failed non-operative management including home exercise, medications, and injections. It is felt that they would benefit from undergoing total joint replacement. Risks and benefits of the procedure have been discussed with the patient and they elect to  proceed with surgery. There are no active contraindications to surgery such as ongoing infection or rapidly progressive neurological disease.  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 Morphine Sulfate (Concentrate) *ANALGESICS - OPIOID*  Nausea, Vomiting.  Family History  Hypertension  First Degree Relatives. father and sister Cerebrovascular Accident  father Cancer  mother, sister, brother and grandmother mothers side Osteoarthritis  mother Heart disease in female family member before age 83   Social History Illicit drug use  no Exercise  Exercises rarely Marital status  married Living situation  live with spouse Current work status  retired Children  2 Drug/Alcohol Rehab (Previously)  no Drug/Alcohol Rehab (Currently)  no Tobacco / smoke exposure  no Pain Contract  no Tobacco use  Never smoker. never smoker No alcohol use  Alcohol use  never consumed alcohol  Medication History Tylenol (prn) Active. Lisinopril (Oral) Specific strength unknown - Active. Potassium Chloride CR (Oral) Specific strength unknown - Active. Allopurinol (Oral) Specific strength unknown - Active.  Past Surgical History Total Knee Replacement  right Mammoplasty; Reduction  bilateral Appendectomy  Gallbladder Surgery  open Breast Reconstruction  bilateral   Review of Systems  General Not Present- Chills, Fatigue, Fever, Memory Loss, Night Sweats, Weight Gain and Weight Loss. Skin Not Present- Eczema, Hives, Itching, Lesions and Rash. HEENT Not Present- Dentures, Double Vision, Headache, Hearing Loss, Tinnitus and Visual Loss. Respiratory Not Present- Allergies, Chronic Cough, Coughing up  blood,  Shortness of breath at rest and Shortness of breath with exertion. Cardiovascular Not Present- Chest Pain, Difficulty Breathing Lying Down, Murmur, Palpitations, Racing/skipping heartbeats and Swelling. Gastrointestinal Not Present- Abdominal Pain, Bloody Stool, Constipation, Diarrhea, Difficulty Swallowing, Heartburn, Jaundice, Loss of appetitie, Nausea and Vomiting. Female Genitourinary Not Present- Blood in Urine, Discharge, Flank Pain, Incontinence, Painful Urination, Urgency, Urinary frequency, Urinary Retention, Urinating at Night and Weak urinary stream. Musculoskeletal Present- Joint Pain. Not Present- Back Pain, Joint Swelling, Morning Stiffness, Muscle Pain, Muscle Weakness and Spasms. Neurological Not Present- Blackout spells, Difficulty with balance, Dizziness, Paralysis, Tremor and Weakness. Psychiatric Not Present- Insomnia.  Vitals Weight: 173 lb Height: 62in Weight was reported by patient. Height was reported by patient. Body Surface Area: 1.8 m Body Mass Index: 31.64 kg/m  Pulse: 84 (Regular)  BP: 122/70 (Sitting, Right Arm, Standard)   Physical Exam General Mental Status -Alert, cooperative and good historian. General Appearance-pleasant, Not in acute distress. Orientation-Oriented X3. Build & Nutrition-Well nourished and Well developed.  Head and Neck Head-normocephalic, atraumatic . Neck Global Assessment - supple, no bruit auscultated on the right, no bruit auscultated on the left.  Eye Vision-Wears corrective lenses(readers). Pupil - Bilateral-Regular and Round. Motion - Bilateral-EOMI.  Chest and Lung Exam Auscultation Breath sounds - clear at anterior chest wall and clear at posterior chest wall. Adventitious sounds - No Adventitious sounds.  Cardiovascular Auscultation Rhythm - Regular rate and rhythm. Heart Sounds - S1 WNL and S2 WNL. Murmurs & Other Heart Sounds - Auscultation of the heart reveals - No  Murmurs.  Abdomen Palpation/Percussion Tenderness - Abdomen is non-tender to palpation. Rigidity (guarding) - Abdomen is soft. Auscultation Auscultation of the abdomen reveals - Bowel sounds normal.  Female Genitourinary Note: Not done, not pertinent to present illness   Musculoskeletal Note: On exam, she is a well developed female, alert and oriented, in no apparent distress. Her left knee shows no effusion. Her range of motion of the left knee is about 5 to 125. There is marked crepitus on range of motion with tenderness medial greater than lateral and no instability noted. Right knee shows a well healed scar with range about 0to 125. No tenderness or instability noted.  RADIOGRAPHS Radiographs are reviewed from about six months ago and shows that she has bone on bone arthritis, medial and patellofemoral in the left knee.  Assessment & Plan  Primary osteoarthritis of one knee, left (Established Diagnosis) (M17.12)  Note:Surgical Plans: Left Total Knee Replacement  Disposition: Home and straight to outpatient.  PCP: Dr. Quillian Quince - Patient has been seen preoperatively and felt to be stable for surgery. "Continue current regimen."  IV TXA  Anesthesia Issues: Nausea and Vomiting  Patient was instructed on what medications to stop prior to surgery.  Signed electronically by Joelene Millin, III PA-C

## 2017-05-08 ENCOUNTER — Inpatient Hospital Stay (HOSPITAL_COMMUNITY): Payer: Medicare Other | Admitting: Anesthesiology

## 2017-05-08 ENCOUNTER — Encounter (HOSPITAL_COMMUNITY)
Admission: RE | Disposition: A | Discharge status: Home / Self Care | Payer: Self-pay | Source: Ambulatory Visit | Attending: Orthopedic Surgery

## 2017-05-08 ENCOUNTER — Encounter (HOSPITAL_COMMUNITY): Payer: Self-pay | Admitting: *Deleted

## 2017-05-08 ENCOUNTER — Inpatient Hospital Stay (HOSPITAL_COMMUNITY)
Admission: RE | Admit: 2017-05-08 | Discharge: 2017-05-10 | DRG: 470 | Disposition: A | Discharge status: Home / Self Care | Payer: Medicare Other | Source: Ambulatory Visit | Attending: Orthopedic Surgery | Admitting: Orthopedic Surgery

## 2017-05-08 DIAGNOSIS — M1712 Unilateral primary osteoarthritis, left knee: Secondary | ICD-10-CM | POA: Diagnosis not present

## 2017-05-08 DIAGNOSIS — Z885 Allergy status to narcotic agent status: Secondary | ICD-10-CM | POA: Diagnosis not present

## 2017-05-08 DIAGNOSIS — M109 Gout, unspecified: Secondary | ICD-10-CM | POA: Diagnosis present

## 2017-05-08 DIAGNOSIS — Z9189 Other specified personal risk factors, not elsewhere classified: Secondary | ICD-10-CM | POA: Diagnosis not present

## 2017-05-08 DIAGNOSIS — M1710 Unilateral primary osteoarthritis, unspecified knee: Secondary | ICD-10-CM | POA: Diagnosis present

## 2017-05-08 DIAGNOSIS — M67332 Transient synovitis, left wrist: Secondary | ICD-10-CM | POA: Diagnosis not present

## 2017-05-08 DIAGNOSIS — Z96651 Presence of right artificial knee joint: Secondary | ICD-10-CM | POA: Diagnosis present

## 2017-05-08 DIAGNOSIS — I10 Essential (primary) hypertension: Secondary | ICD-10-CM | POA: Diagnosis present

## 2017-05-08 DIAGNOSIS — G8918 Other acute postprocedural pain: Secondary | ICD-10-CM | POA: Diagnosis not present

## 2017-05-08 DIAGNOSIS — Z79899 Other long term (current) drug therapy: Secondary | ICD-10-CM | POA: Diagnosis not present

## 2017-05-08 DIAGNOSIS — Z888 Allergy status to other drugs, medicaments and biological substances status: Secondary | ICD-10-CM | POA: Diagnosis not present

## 2017-05-08 DIAGNOSIS — M179 Osteoarthritis of knee, unspecified: Secondary | ICD-10-CM

## 2017-05-08 HISTORY — PX: TOTAL KNEE ARTHROPLASTY: SHX125

## 2017-05-08 LAB — TYPE AND SCREEN
ABO/RH(D): O POS
Antibody Screen: NEGATIVE

## 2017-05-08 SURGERY — ARTHROPLASTY, KNEE, TOTAL
Anesthesia: Regional | Site: Knee | Laterality: Left

## 2017-05-08 MED ORDER — ONDANSETRON HCL 4 MG/2ML IJ SOLN
4.0000 mg | Freq: Four times a day (QID) | INTRAMUSCULAR | Status: DC | PRN
  Administered 2017-05-09 – 2017-05-10 (×2): 4 mg via INTRAVENOUS
  Filled 2017-05-08 (×2): qty 2

## 2017-05-08 MED ORDER — FENTANYL CITRATE (PF) 100 MCG/2ML IJ SOLN
INTRAMUSCULAR | Status: AC
  Administered 2017-05-08: 100 ug via INTRAVENOUS
  Filled 2017-05-08: qty 2

## 2017-05-08 MED ORDER — ROPIVACAINE HCL 5 MG/ML IJ SOLN
INTRAMUSCULAR | Status: DC | PRN
  Administered 2017-05-08: 30 mL via PERINEURAL

## 2017-05-08 MED ORDER — PHENYLEPHRINE 40 MCG/ML (10ML) SYRINGE FOR IV PUSH (FOR BLOOD PRESSURE SUPPORT)
PREFILLED_SYRINGE | INTRAVENOUS | Status: AC
  Filled 2017-05-08: qty 10

## 2017-05-08 MED ORDER — METOCLOPRAMIDE HCL 5 MG PO TABS
5.0000 mg | ORAL_TABLET | Freq: Three times a day (TID) | ORAL | Status: DC | PRN
  Administered 2017-05-10: 10 mg via ORAL

## 2017-05-08 MED ORDER — ONDANSETRON HCL 4 MG/2ML IJ SOLN
INTRAMUSCULAR | Status: DC | PRN
  Administered 2017-05-08: 4 mg via INTRAVENOUS

## 2017-05-08 MED ORDER — HYDROMORPHONE HCL 2 MG PO TABS
2.0000 mg | ORAL_TABLET | ORAL | Status: DC | PRN
  Administered 2017-05-08 – 2017-05-09 (×4): 2 mg via ORAL
  Administered 2017-05-09: 4 mg via ORAL
  Administered 2017-05-09: 2 mg via ORAL
  Administered 2017-05-09 – 2017-05-10 (×2): 4 mg via ORAL
  Filled 2017-05-08 (×2): qty 1
  Filled 2017-05-08: qty 2
  Filled 2017-05-08 (×2): qty 1
  Filled 2017-05-08 (×2): qty 2
  Filled 2017-05-08: qty 1

## 2017-05-08 MED ORDER — METHOCARBAMOL 1000 MG/10ML IJ SOLN
500.0000 mg | Freq: Four times a day (QID) | INTRAMUSCULAR | Status: DC | PRN
  Filled 2017-05-08: qty 5

## 2017-05-08 MED ORDER — MENTHOL 3 MG MT LOZG: 1.0000 | LOZENGE | OROMUCOSAL | Status: DC | PRN

## 2017-05-08 MED ORDER — CEFAZOLIN SODIUM-DEXTROSE 2-4 GM/100ML-% IV SOLN
2.0000 g | INTRAVENOUS | Status: AC
  Administered 2017-05-08: 2 g via INTRAVENOUS

## 2017-05-08 MED ORDER — ONDANSETRON HCL 4 MG/2ML IJ SOLN
INTRAMUSCULAR | Status: AC
  Filled 2017-05-08: qty 2

## 2017-05-08 MED ORDER — GABAPENTIN 300 MG PO CAPS
300.0000 mg | ORAL_CAPSULE | Freq: Once | ORAL | Status: AC
  Administered 2017-05-08: 300 mg via ORAL

## 2017-05-08 MED ORDER — KETOROLAC TROMETHAMINE 30 MG/ML IJ SOLN: 30.0000 mg | Freq: Once | INTRAMUSCULAR | Status: DC | PRN

## 2017-05-08 MED ORDER — CHLORHEXIDINE GLUCONATE 4 % EX LIQD: 60.0000 mL | Freq: Once | CUTANEOUS | Status: DC

## 2017-05-08 MED ORDER — ONDANSETRON HCL 4 MG PO TABS
4.0000 mg | ORAL_TABLET | Freq: Four times a day (QID) | ORAL | Status: DC | PRN
  Administered 2017-05-09 – 2017-05-10 (×2): 4 mg via ORAL
  Filled 2017-05-08 (×2): qty 1

## 2017-05-08 MED ORDER — ACETAMINOPHEN 500 MG PO TABS
1000.0000 mg | ORAL_TABLET | Freq: Four times a day (QID) | ORAL | Status: AC
  Administered 2017-05-08 – 2017-05-09 (×4): 1000 mg via ORAL
  Filled 2017-05-08 (×4): qty 2

## 2017-05-08 MED ORDER — TRAMADOL HCL 50 MG PO TABS
50.0000 mg | ORAL_TABLET | Freq: Four times a day (QID) | ORAL | Status: DC | PRN
  Administered 2017-05-10: 50 mg via ORAL
  Filled 2017-05-08 (×2): qty 1

## 2017-05-08 MED ORDER — ALLOPURINOL 300 MG PO TABS
300.0000 mg | ORAL_TABLET | Freq: Every day | ORAL | Status: DC
  Administered 2017-05-09 – 2017-05-10 (×2): 300 mg via ORAL
  Filled 2017-05-08 (×2): qty 1

## 2017-05-08 MED ORDER — FENTANYL CITRATE (PF) 100 MCG/2ML IJ SOLN
50.0000 ug | INTRAMUSCULAR | Status: DC | PRN
  Administered 2017-05-08: 100 ug via INTRAVENOUS

## 2017-05-08 MED ORDER — PROPOFOL 10 MG/ML IV BOLUS
INTRAVENOUS | Status: AC
  Filled 2017-05-08: qty 60

## 2017-05-08 MED ORDER — PROPOFOL 500 MG/50ML IV EMUL
INTRAVENOUS | Status: DC | PRN
  Administered 2017-05-08: 75 ug/kg/min via INTRAVENOUS

## 2017-05-08 MED ORDER — DEXAMETHASONE SODIUM PHOSPHATE 10 MG/ML IJ SOLN
10.0000 mg | Freq: Once | INTRAMUSCULAR | Status: AC
  Administered 2017-05-09: 10 mg via INTRAVENOUS
  Filled 2017-05-08: qty 1

## 2017-05-08 MED ORDER — SODIUM CHLORIDE 0.9 % IJ SOLN
INTRAMUSCULAR | Status: DC | PRN
Start: 2017-05-08 — End: 2017-05-08
  Administered 2017-05-08: 30 mL

## 2017-05-08 MED ORDER — LIDOCAINE 2% (20 MG/ML) 5 ML SYRINGE
INTRAMUSCULAR | Status: DC | PRN
  Administered 2017-05-08: 60 mg via INTRAVENOUS

## 2017-05-08 MED ORDER — SODIUM CHLORIDE 0.9 % IJ SOLN
INTRAMUSCULAR | Status: AC
  Filled 2017-05-08: qty 50

## 2017-05-08 MED ORDER — PROPOFOL 10 MG/ML IV BOLUS
INTRAVENOUS | Status: DC | PRN
  Administered 2017-05-08: 20 mg via INTRAVENOUS
  Administered 2017-05-08: 30 mg via INTRAVENOUS

## 2017-05-08 MED ORDER — FLEET ENEMA 7-19 GM/118ML RE ENEM: 1.0000 | ENEMA | Freq: Once | RECTAL | Status: DC | PRN

## 2017-05-08 MED ORDER — STERILE WATER FOR IRRIGATION IR SOLN
Status: DC | PRN
  Administered 2017-05-08: 2000 mL

## 2017-05-08 MED ORDER — LACTATED RINGERS IV SOLN
INTRAVENOUS | Status: DC
  Administered 2017-05-08 (×2): via INTRAVENOUS

## 2017-05-08 MED ORDER — PHENYLEPHRINE HCL 10 MG/ML IJ SOLN
INTRAMUSCULAR | Status: DC | PRN
  Administered 2017-05-08 (×2): 80 ug via INTRAVENOUS
  Administered 2017-05-08: 120 ug via INTRAVENOUS
  Administered 2017-05-08 (×2): 80 ug via INTRAVENOUS

## 2017-05-08 MED ORDER — BUPIVACAINE LIPOSOME 1.3 % IJ SUSP
INTRAMUSCULAR | Status: DC | PRN
  Administered 2017-05-08: 20 mL

## 2017-05-08 MED ORDER — ACETAMINOPHEN 10 MG/ML IV SOLN
INTRAVENOUS | Status: AC
  Filled 2017-05-08: qty 100

## 2017-05-08 MED ORDER — DIPHENHYDRAMINE HCL 12.5 MG/5ML PO ELIX
12.5000 mg | ORAL_SOLUTION | ORAL | Status: DC | PRN
Start: 2017-05-08 — End: 2017-05-10

## 2017-05-08 MED ORDER — HYDROMORPHONE HCL 1 MG/ML IJ SOLN: 0.2500 mg | INTRAMUSCULAR | Status: DC | PRN

## 2017-05-08 MED ORDER — RIVAROXABAN 10 MG PO TABS
10.0000 mg | ORAL_TABLET | Freq: Every day | ORAL | Status: DC
  Administered 2017-05-09 – 2017-05-10 (×2): 10 mg via ORAL
  Filled 2017-05-08 (×2): qty 1

## 2017-05-08 MED ORDER — SODIUM CHLORIDE 0.9 % IR SOLN
Status: DC | PRN
  Administered 2017-05-08: 1000 mL

## 2017-05-08 MED ORDER — PROMETHAZINE HCL 25 MG/ML IJ SOLN: 6.2500 mg | INTRAMUSCULAR | Status: DC | PRN

## 2017-05-08 MED ORDER — BISACODYL 10 MG RE SUPP: 10.0000 mg | Freq: Every day | RECTAL | Status: DC | PRN

## 2017-05-08 MED ORDER — BUPIVACAINE HCL (PF) 0.25 % IJ SOLN
INTRAMUSCULAR | Status: AC
  Filled 2017-05-08: qty 30

## 2017-05-08 MED ORDER — METOCLOPRAMIDE HCL 5 MG/ML IJ SOLN
5.0000 mg | Freq: Three times a day (TID) | INTRAMUSCULAR | Status: DC | PRN
  Administered 2017-05-10: 10 mg via INTRAVENOUS
  Filled 2017-05-08 (×2): qty 2

## 2017-05-08 MED ORDER — POTASSIUM CHLORIDE ER 10 MEQ PO TBCR
50.0000 meq | EXTENDED_RELEASE_TABLET | Freq: Every day | ORAL | Status: DC
  Administered 2017-05-09: 50 meq via ORAL
  Filled 2017-05-08 (×3): qty 5

## 2017-05-08 MED ORDER — DOCUSATE SODIUM 100 MG PO CAPS
100.0000 mg | ORAL_CAPSULE | Freq: Two times a day (BID) | ORAL | Status: DC
  Administered 2017-05-08 – 2017-05-10 (×4): 100 mg via ORAL
  Filled 2017-05-08 (×4): qty 1

## 2017-05-08 MED ORDER — HYDROMORPHONE HCL 1 MG/ML IJ SOLN: 0.5000 mg | INTRAMUSCULAR | Status: DC | PRN

## 2017-05-08 MED ORDER — ACETAMINOPHEN 650 MG RE SUPP: 650.0000 mg | Freq: Four times a day (QID) | RECTAL | Status: DC | PRN

## 2017-05-08 MED ORDER — 0.9 % SODIUM CHLORIDE (POUR BTL) OPTIME
TOPICAL | Status: DC | PRN
  Administered 2017-05-08: 1000 mL

## 2017-05-08 MED ORDER — TRANEXAMIC ACID 1000 MG/10ML IV SOLN
1000.0000 mg | INTRAVENOUS | Status: AC
  Administered 2017-05-08: 1000 mg via INTRAVENOUS
  Filled 2017-05-08: qty 1100

## 2017-05-08 MED ORDER — MIDAZOLAM HCL 2 MG/2ML IJ SOLN
INTRAMUSCULAR | Status: AC
  Administered 2017-05-08: 2 mg via INTRAVENOUS
  Filled 2017-05-08: qty 2

## 2017-05-08 MED ORDER — TRANEXAMIC ACID 1000 MG/10ML IV SOLN
1000.0000 mg | Freq: Once | INTRAVENOUS | Status: AC
  Administered 2017-05-08: 1000 mg via INTRAVENOUS
  Filled 2017-05-08: qty 1100

## 2017-05-08 MED ORDER — ACETAMINOPHEN 10 MG/ML IV SOLN
1000.0000 mg | Freq: Once | INTRAVENOUS | Status: AC
  Administered 2017-05-08: 1000 mg via INTRAVENOUS

## 2017-05-08 MED ORDER — GABAPENTIN 300 MG PO CAPS
ORAL_CAPSULE | ORAL | Status: AC
  Administered 2017-05-08: 300 mg via ORAL
  Filled 2017-05-08: qty 1

## 2017-05-08 MED ORDER — LOSARTAN POTASSIUM 50 MG PO TABS
100.0000 mg | ORAL_TABLET | Freq: Every day | ORAL | Status: DC
  Filled 2017-05-08: qty 2

## 2017-05-08 MED ORDER — MIDAZOLAM HCL 2 MG/2ML IJ SOLN
1.0000 mg | INTRAMUSCULAR | Status: DC | PRN
Start: 2017-05-08 — End: 2017-05-08
  Administered 2017-05-08: 2 mg via INTRAVENOUS
  Filled 2017-05-08: qty 2

## 2017-05-08 MED ORDER — CEFAZOLIN SODIUM-DEXTROSE 2-4 GM/100ML-% IV SOLN
INTRAVENOUS | Status: AC
  Filled 2017-05-08: qty 100

## 2017-05-08 MED ORDER — CEFAZOLIN SODIUM-DEXTROSE 2-4 GM/100ML-% IV SOLN
2.0000 g | Freq: Four times a day (QID) | INTRAVENOUS | Status: AC
  Administered 2017-05-08 (×2): 2 g via INTRAVENOUS
  Filled 2017-05-08 (×2): qty 100

## 2017-05-08 MED ORDER — MEPERIDINE HCL 50 MG/ML IJ SOLN: 6.2500 mg | INTRAMUSCULAR | Status: DC | PRN

## 2017-05-08 MED ORDER — LIDOCAINE 2% (20 MG/ML) 5 ML SYRINGE
INTRAMUSCULAR | Status: AC
Start: 2017-05-08 — End: 2017-05-08
  Filled 2017-05-08: qty 5

## 2017-05-08 MED ORDER — POLYETHYLENE GLYCOL 3350 17 G PO PACK
17.0000 g | PACK | Freq: Every day | ORAL | Status: DC | PRN
  Administered 2017-05-10: 17 g via ORAL
  Filled 2017-05-08: qty 1

## 2017-05-08 MED ORDER — SODIUM CHLORIDE 0.9 % IV SOLN
INTRAVENOUS | Status: DC
  Administered 2017-05-08 (×2): via INTRAVENOUS

## 2017-05-08 MED ORDER — BUPIVACAINE IN DEXTROSE 0.75-8.25 % IT SOLN
INTRATHECAL | Status: DC | PRN
  Administered 2017-05-08: 2 mL via INTRATHECAL

## 2017-05-08 MED ORDER — PHENOL 1.4 % MT LIQD: 1.0000 | OROMUCOSAL | Status: DC | PRN

## 2017-05-08 MED ORDER — LIDOCAINE 2% (20 MG/ML) 5 ML SYRINGE
INTRAMUSCULAR | Status: AC
  Filled 2017-05-08: qty 5

## 2017-05-08 MED ORDER — BUPIVACAINE LIPOSOME 1.3 % IJ SUSP
20.0000 mL | Freq: Once | INTRAMUSCULAR | Status: DC
  Filled 2017-05-08: qty 20

## 2017-05-08 MED ORDER — METHOCARBAMOL 500 MG PO TABS
500.0000 mg | ORAL_TABLET | Freq: Four times a day (QID) | ORAL | Status: DC | PRN
  Administered 2017-05-08 – 2017-05-10 (×6): 500 mg via ORAL
  Filled 2017-05-08 (×6): qty 1

## 2017-05-08 MED ORDER — DEXAMETHASONE SODIUM PHOSPHATE 10 MG/ML IJ SOLN
10.0000 mg | Freq: Once | INTRAMUSCULAR | Status: AC
  Administered 2017-05-08: 10 mg via INTRAVENOUS

## 2017-05-08 MED ORDER — ACETAMINOPHEN 325 MG PO TABS
650.0000 mg | ORAL_TABLET | Freq: Four times a day (QID) | ORAL | Status: DC | PRN
  Administered 2017-05-09: 650 mg via ORAL
  Filled 2017-05-08: qty 2

## 2017-05-08 MED ORDER — DEXAMETHASONE SODIUM PHOSPHATE 10 MG/ML IJ SOLN
INTRAMUSCULAR | Status: AC
  Filled 2017-05-08: qty 1

## 2017-05-08 SURGICAL SUPPLY — 50 items
BAG DECANTER FOR FLEXI CONT (MISCELLANEOUS) ×1 IMPLANT
BAG SPEC THK2 15X12 ZIP CLS (MISCELLANEOUS) ×1
BAG ZIPLOCK 12X15 (MISCELLANEOUS) ×2 IMPLANT
BANDAGE ACE 6X5 VEL STRL LF (GAUZE/BANDAGES/DRESSINGS) ×2 IMPLANT
BLADE SAG 18X100X1.27 (BLADE) ×2 IMPLANT
BLADE SAW SGTL 11.0X1.19X90.0M (BLADE) ×2 IMPLANT
BOWL SMART MIX CTS (DISPOSABLE) ×2 IMPLANT
CAPT KNEE TOTAL 3 ATTUNE ×1 IMPLANT
CEMENT HV SMART SET (Cement) ×3 IMPLANT
COVER SURGICAL LIGHT HANDLE (MISCELLANEOUS) ×2 IMPLANT
CUFF TOURN SGL QUICK 34 (TOURNIQUET CUFF) ×2
CUFF TRNQT CYL 34X4X40X1 (TOURNIQUET CUFF) ×1 IMPLANT
DECANTER SPIKE VIAL GLASS SM (MISCELLANEOUS) ×2 IMPLANT
DRAPE U-SHAPE 47X51 STRL (DRAPES) ×2 IMPLANT
DRSG ADAPTIC 3X8 NADH LF (GAUZE/BANDAGES/DRESSINGS) ×2 IMPLANT
DRSG PAD ABDOMINAL 8X10 ST (GAUZE/BANDAGES/DRESSINGS) ×2 IMPLANT
DURAPREP 26ML APPLICATOR (WOUND CARE) ×2 IMPLANT
ELECT REM PT RETURN 15FT ADLT (MISCELLANEOUS) ×2 IMPLANT
EVACUATOR 1/8 PVC DRAIN (DRAIN) ×2 IMPLANT
GAUZE SPONGE 4X4 12PLY STRL (GAUZE/BANDAGES/DRESSINGS) ×2 IMPLANT
GLOVE BIO SURGEON STRL SZ7.5 (GLOVE) ×1 IMPLANT
GLOVE BIO SURGEON STRL SZ8 (GLOVE) ×2 IMPLANT
GLOVE BIOGEL PI IND STRL 6.5 (GLOVE) IMPLANT
GLOVE BIOGEL PI IND STRL 8 (GLOVE) ×1 IMPLANT
GLOVE BIOGEL PI INDICATOR 6.5 (GLOVE)
GLOVE BIOGEL PI INDICATOR 8 (GLOVE) ×2
GLOVE SURG SS PI 6.5 STRL IVOR (GLOVE) IMPLANT
GOWN STRL REUS W/TWL LRG LVL3 (GOWN DISPOSABLE) ×2 IMPLANT
GOWN STRL REUS W/TWL XL LVL3 (GOWN DISPOSABLE) ×1 IMPLANT
HANDPIECE INTERPULSE COAX TIP (DISPOSABLE) ×2
IMMOBILIZER KNEE 20 (SOFTGOODS) ×2
IMMOBILIZER KNEE 20 THIGH 36 (SOFTGOODS) ×1 IMPLANT
MANIFOLD NEPTUNE II (INSTRUMENTS) ×2 IMPLANT
NS IRRIG 1000ML POUR BTL (IV SOLUTION) ×2 IMPLANT
PACK TOTAL KNEE CUSTOM (KITS) ×2 IMPLANT
PADDING CAST COTTON 6X4 STRL (CAST SUPPLIES) ×5 IMPLANT
POSITIONER SURGICAL ARM (MISCELLANEOUS) ×2 IMPLANT
SET HNDPC FAN SPRY TIP SCT (DISPOSABLE) ×1 IMPLANT
STRIP CLOSURE SKIN 1/2X4 (GAUZE/BANDAGES/DRESSINGS) ×4 IMPLANT
SUT MNCRL AB 4-0 PS2 18 (SUTURE) ×2 IMPLANT
SUT STRATAFIX 0 PDS 27 VIOLET (SUTURE) ×2
SUT VIC AB 2-0 CT1 27 (SUTURE) ×6
SUT VIC AB 2-0 CT1 TAPERPNT 27 (SUTURE) ×3 IMPLANT
SUTURE STRATFX 0 PDS 27 VIOLET (SUTURE) ×1 IMPLANT
SYR 50ML LL SCALE MARK (SYRINGE) IMPLANT
TRAY FOLEY CATH 14FRSI W/METER (CATHETERS) ×1 IMPLANT
TRAY FOLEY W/METER SILVER 16FR (SET/KITS/TRAYS/PACK) ×1 IMPLANT
WATER STERILE IRR 1000ML POUR (IV SOLUTION) ×4 IMPLANT
WRAP KNEE MAXI GEL POST OP (GAUZE/BANDAGES/DRESSINGS) ×2 IMPLANT
YANKAUER SUCT BULB TIP 10FT TU (MISCELLANEOUS) ×2 IMPLANT

## 2017-05-08 NOTE — Anesthesia Postprocedure Evaluation (Signed)
Anesthesia Post Note  Patient: Karen Tucker  Procedure(s) Performed: Procedure(s) (LRB): LEFT TOTAL KNEE ARTHROPLASTY (Left)  Patient location during evaluation: PACU Anesthesia Type: Regional and Spinal Level of consciousness: awake and alert Pain management: pain level controlled Vital Signs Assessment: post-procedure vital signs reviewed and stable Respiratory status: spontaneous breathing and respiratory function stable Cardiovascular status: blood pressure returned to baseline and stable Postop Assessment: spinal receding Anesthetic complications: no       Last Vitals:  Vitals:   05/08/17 1235 05/08/17 1416  BP: 117/64 (!) 108/51  Pulse: 74 73  Resp: 13 13  Temp: 36.4 C 36.7 C    Last Pain:  Vitals:   05/08/17 1416  TempSrc: Oral  PainSc:                  Nolon Nations

## 2017-05-08 NOTE — Evaluation (Signed)
Physical Therapy Evaluation Patient Details Name: Karen Tucker MRN: [id number] DOB: [birth year] Today's Date: 05/08/2017   History of Present Illness  Pt is a 66 year old female s/p L TKA with hx of R TKA  Clinical Impression  Pt is s/p TKA resulting in the deficits listed below (see PT Problem List).  Pt will benefit from skilled PT to increase their independence and safety with mobility to allow discharge to the venue listed below.  Pt tolerated short distance ambulation POD#0 however limited by nausea and emesis.  Pt plans to d/c home with spouse.     Follow Up Recommendations DC plan and follow up therapy as arranged by surgeon;Outpatient PT    Equipment Recommendations  None recommended by PT    Recommendations for Other Services       Precautions / Restrictions Precautions Precautions: Knee;Fall Required Braces or Orthoses: Knee Immobilizer - Left Restrictions Other Position/Activity Restrictions: WBAT      Mobility  Bed Mobility Overal bed mobility: Needs Assistance Bed Mobility: Supine to Sit     Supine to sit: Min assist     General bed mobility comments: verbal cues for technique, assist for L LE  Transfers Overall transfer level: Needs assistance Equipment used: Rolling walker (2 wheeled) Transfers: Sit to/from Stand Sit to Stand: Min assist         General transfer comment: verbal cues for UE and LE positioning  Ambulation/Gait Ambulation/Gait assistance: Min guard Ambulation Distance (Feet): 24 Feet Assistive device: Rolling walker (2 wheeled) Gait Pattern/deviations: Step-to pattern;Decreased stance time - left;Antalgic     General Gait Details: verbal cues for sequence, RW positioning, step length, distance limited due to nausea, pt with emesis upon returning to recliner then felt better (RN aware)  Stairs            Wheelchair Mobility    Modified Rankin (Stroke Patients Only)       Balance                                              Pertinent Vitals/Pain Pain Assessment: 0-10 Pain Score: 6  Pain Location: L knee Pain Descriptors / Indicators: Sore;Aching Pain Intervention(s): Limited activity within patient's tolerance;Monitored during session;Repositioned    Home Living Family/patient expects to be discharged to:: Private residence Living Arrangements: Spouse/significant other   Type of Home: House Home Access: Level entry     Home Layout: Able to live on main level with bedroom/bathroom Home Equipment: Environmental consultant - 2 wheels;Bedside commode      Prior Function Level of Independence: Independent               Hand Dominance        Extremity/Trunk Assessment        Lower Extremity Assessment Lower Extremity Assessment: LLE deficits/detail LLE Deficits / Details: unable to perform SLR, maintained KI, ROM TBA       Communication   Communication: No difficulties  Cognition Arousal/Alertness: Awake/alert Behavior During Therapy: WFL for tasks assessed/performed Overall Cognitive Status: Within Functional Limits for tasks assessed                                        General Comments      Exercises     Assessment/Plan  PT Assessment Patient needs continued PT services  PT Problem List Decreased range of motion;Decreased strength;Decreased mobility;Decreased knowledge of precautions;Pain;Decreased knowledge of use of DME       PT Treatment Interventions Functional mobility training;Stair training;Gait training;DME instruction;Therapeutic activities;Therapeutic exercise;Patient/family education    PT Goals (Current goals can be found in the Care Plan section)  Acute Rehab PT Goals PT Goal Formulation: With patient Time For Goal Achievement: 05/12/17 Potential to Achieve Goals: Good    Frequency 7X/week   Barriers to discharge        Co-evaluation               AM-PAC PT "6 Clicks" Daily Activity  Outcome Measure  Difficulty turning over in bed (including adjusting bedclothes, sheets and blankets)?: None Difficulty moving from lying on back to sitting on the side of the bed? : A Little Difficulty sitting down on and standing up from a chair with arms (e.g., wheelchair, bedside commode, etc,.)?: A Little Help needed moving to and from a bed to chair (including a wheelchair)?: A Little Help needed walking in hospital room?: A Little Help needed climbing 3-5 steps with a railing? : A Lot 6 Click Score: 18    End of Session Equipment Utilized During Treatment: Gait belt;Left knee immobilizer Activity Tolerance: Other (comment) (limited by nausea, emesis) Patient left: in chair;with call bell/phone within reach;with chair alarm set   PT Visit Diagnosis: Other abnormalities of gait and mobility (R26.89);Pain Pain - Right/Left: Left Pain - part of body: Knee    Time: 1740-8144 PT Time Calculation (min) (ACUTE ONLY): 21 min   Charges:   PT Evaluation $PT Eval Low Complexity: 1 Procedure     PT G Codes:        Carmelia Bake, PT, DPT 05/08/2017 Pager: [phone]   York Ram E 05/08/2017, 4:07 PM

## 2017-05-08 NOTE — H&P (View-Only) (Signed)
Karen Tucker DOB: 1951/05/09 Married / Language: English / Race: White Female Date of Admission:  05/08/2017 CC:  Left Knee Pain History of Present Illness The patient is a 66 year old female who comes in for a preoperative History and Physical. The patient is scheduled for a left total knee arthroplasty to be performed by Dr. Dione Plover. Aluisio, MD at [facility] on 05-08-2017. The patient is a 66 year old female who presented for follow up of their knee. The patient is being followed for their left knee pain and osteoarthritis. They are now months out from Euflexxa series. Symptoms reported include: pain, swelling, aching, stiffness (especially in the mornings), giving way (occasionally) and difficulty ambulating. The patient feels that they are doing poorly. Current treatment includes: icing. The following medication has been used for pain control: Tylenol. The patient has not gotten any relief of their symptoms with viscosupplementation. Unfortunately, her left knee is getting progressively worse. The Euflexxa injections did not provide much benefit. Left knee is actually hurting her as bad or worse than the right it did prior to when she had that replaced about nine years ago. She feels as though the knee is now preventing her from doing more things. The Euflexxa injections unfortunately did not help. She has also had cortisone, which did not provide any lasting benefit. She is now ready to get the left knee replaced at this time. They have been treated conservatively in the past for the above stated problem and despite conservative measures, they continue to have progressive pain and severe functional limitations and dysfunction. They have failed non-operative management including home exercise, medications, and injections. It is felt that they would benefit from undergoing total joint replacement. Risks and benefits of the procedure have been discussed with the patient and they elect to  proceed with surgery. There are no active contraindications to surgery such as ongoing infection or rapidly progressive neurological disease.  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 Morphine Sulfate (Concentrate) *ANALGESICS - OPIOID*  Nausea, Vomiting.  Family History  Hypertension  First Degree Relatives. father and sister Cerebrovascular Accident  father Cancer  mother, sister, brother and grandmother mothers side Osteoarthritis  mother Heart disease in female family member before age 38   Social History Illicit drug use  no Exercise  Exercises rarely Marital status  married Living situation  live with spouse Current work status  retired Children  2 Drug/Alcohol Rehab (Previously)  no Drug/Alcohol Rehab (Currently)  no Tobacco / smoke exposure  no Pain Contract  no Tobacco use  Never smoker. never smoker No alcohol use  Alcohol use  never consumed alcohol  Medication History Tylenol (prn) Active. Lisinopril (Oral) Specific strength unknown - Active. Potassium Chloride CR (Oral) Specific strength unknown - Active. Allopurinol (Oral) Specific strength unknown - Active.  Past Surgical History Total Knee Replacement  right Mammoplasty; Reduction  bilateral Appendectomy  Gallbladder Surgery  open Breast Reconstruction  bilateral   Review of Systems  General Not Present- Chills, Fatigue, Fever, Memory Loss, Night Sweats, Weight Gain and Weight Loss. Skin Not Present- Eczema, Hives, Itching, Lesions and Rash. HEENT Not Present- Dentures, Double Vision, Headache, Hearing Loss, Tinnitus and Visual Loss. Respiratory Not Present- Allergies, Chronic Cough, Coughing up  blood,  Shortness of breath at rest and Shortness of breath with exertion. Cardiovascular Not Present- Chest Pain, Difficulty Breathing Lying Down, Murmur, Palpitations, Racing/skipping heartbeats and Swelling. Gastrointestinal Not Present- Abdominal Pain, Bloody Stool, Constipation, Diarrhea, Difficulty Swallowing, Heartburn, Jaundice, Loss of appetitie, Nausea and Vomiting. Female Genitourinary Not Present- Blood in Urine, Discharge, Flank Pain, Incontinence, Painful Urination, Urgency, Urinary frequency, Urinary Retention, Urinating at Night and Weak urinary stream. Musculoskeletal Present- Joint Pain. Not Present- Back Pain, Joint Swelling, Morning Stiffness, Muscle Pain, Muscle Weakness and Spasms. Neurological Not Present- Blackout spells, Difficulty with balance, Dizziness, Paralysis, Tremor and Weakness. Psychiatric Not Present- Insomnia.  Vitals Weight: 173 lb Height: 62in Weight was reported by patient. Height was reported by patient. Body Surface Area: 1.8 m Body Mass Index: 31.64 kg/m  Pulse: 84 (Regular)  BP: 122/70 (Sitting, Right Arm, Standard)   Physical Exam General Mental Status -Alert, cooperative and good historian. General Appearance-pleasant, Not in acute distress. Orientation-Oriented X3. Build & Nutrition-Well nourished and Well developed.  Head and Neck Head-normocephalic, atraumatic . Neck Global Assessment - supple, no bruit auscultated on the right, no bruit auscultated on the left.  Eye Vision-Wears corrective lenses(readers). Pupil - Bilateral-Regular and Round. Motion - Bilateral-EOMI.  Chest and Lung Exam Auscultation Breath sounds - clear at anterior chest wall and clear at posterior chest wall. Adventitious sounds - No Adventitious sounds.  Cardiovascular Auscultation Rhythm - Regular rate and rhythm. Heart Sounds - S1 WNL and S2 WNL. Murmurs & Other Heart Sounds - Auscultation of the heart reveals - No  Murmurs.  Abdomen Palpation/Percussion Tenderness - Abdomen is non-tender to palpation. Rigidity (guarding) - Abdomen is soft. Auscultation Auscultation of the abdomen reveals - Bowel sounds normal.  Female Genitourinary Note: Not done, not pertinent to present illness   Musculoskeletal Note: On exam, she is a well developed female, alert and oriented, in no apparent distress. Her left knee shows no effusion. Her range of motion of the left knee is about 5 to 125. There is marked crepitus on range of motion with tenderness medial greater than lateral and no instability noted. Right knee shows a well healed scar with range about 0to 125. No tenderness or instability noted.  RADIOGRAPHS Radiographs are reviewed from about six months ago and shows that she has bone on bone arthritis, medial and patellofemoral in the left knee.  Assessment & Plan  Primary osteoarthritis of one knee, left (Established Diagnosis) (M17.12)  Note:Surgical Plans: Left Total Knee Replacement  Disposition: Home and straight to outpatient.  PCP: Dr. Quillian Quince - Patient has been seen preoperatively and felt to be stable for surgery. "Continue current regimen."  IV TXA  Anesthesia Issues: Nausea and Vomiting  Patient was instructed on what medications to stop prior to surgery.  Signed electronically by Joelene Millin, III PA-C

## 2017-05-08 NOTE — Anesthesia Procedure Notes (Signed)
Anesthesia Regional Block: Adductor canal block   Pre-Anesthetic Checklist: ,, timeout performed, Correct Patient, Correct Site, Correct Laterality, Correct Procedure, Correct Position, site marked, Risks and benefits discussed,  Surgical consent,  Pre-op evaluation,  At surgeon's request and post-op pain management  Laterality: Left  Prep: chloraprep       Needles:  Injection technique: Single-shot  Needle Type: Stimiplex     Needle Length: 9cm  Needle Gauge: 21     Additional Needles:   Procedures: ultrasound guided,,,,,,,,  Narrative:  Start time: 05/08/2017 7:35 AM End time: 05/08/2017 7:38 AM Injection made incrementally with aspirations every 5 mL.  Performed by: Personally  Anesthesiologist: Nolon Nations  Additional Notes: BP cuff, EKG monitors applied. Sedation begun. Artery and nerve location verified with U/S and anesthetic injected incrementally, slowly, and after negative aspirations under direct u/s guidance. Good fascial /perineural spread. Tolerated well.

## 2017-05-08 NOTE — Op Note (Signed)
OPERATIVE REPORT-TOTAL KNEE ARTHROPLASTY   Pre-operative diagnosis- Osteoarthritis  Left knee(s)  Post-operative diagnosis- Osteoarthritis Left knee(s)  Procedure-  Left  Total Knee Arthroplasty (Depuy Attune)  Surgeon- Dione Plover. Karyn Brull, MD  Assistant- Arlee Muslim, PA-C   Anesthesia-  Adductor canal block and spinal  EBL-* No blood loss amount entered *   Drains Hemovac  Tourniquet time-  Total Tourniquet Time Documented: Thigh (Left) - 31 minutes Total: Thigh (Left) - 31 minutes     Complications- None  Condition-PACU - hemodynamically stable.   Brief Clinical Note  Karen Tucker is a 66 y.o. year old female with end stage OA of her left knee with progressively worsening pain and dysfunction. She has constant pain, with activity and at rest and significant functional deficits with difficulties even with ADLs. She has had extensive non-op management including analgesics, injections of cortisone and viscosupplements, and home exercise program, but remains in significant pain with significant dysfunction. Radiographs show bone on bone arthritis medial and patellofemoral. She presents now for left Total Knee Arthroplasty.    Procedure in detail---   The patient is brought into the operating room and positioned supine on the operating table. After successful administration of  Adductor canal block and spinal,   a tourniquet is placed high on the  Left thigh(s) and the lower extremity is prepped and draped in the usual sterile fashion. Time out is performed by the operating team and then the  Left lower extremity is wrapped in Esmarch, knee flexed and the tourniquet inflated to 300 mmHg.       A midline incision is made with a ten blade through the subcutaneous tissue to the level of the extensor mechanism. A fresh blade is used to make a medial parapatellar arthrotomy. Soft tissue over the proximal medial tibia is subperiosteally elevated to the joint line with a knife and into the  semimembranosus bursa with a Cobb elevator. Soft tissue over the proximal lateral tibia is elevated with attention being paid to avoiding the patellar tendon on the tibial tubercle. The patella is everted, knee flexed 90 degrees and the ACL and PCL are removed. Findings are bone on bone medial and patellofemoral with large global osteophytes.        The drill is used to create a starting hole in the distal femur and the canal is thoroughly irrigated with sterile saline to remove the fatty contents. The 5 degree Left  valgus alignment guide is placed into the femoral canal and the distal femoral cutting block is pinned to remove 9 mm off the distal femur. Resection is made with an oscillating saw.      The tibia is subluxed forward and the menisci are removed. The extramedullary alignment guide is placed referencing proximally at the medial aspect of the tibial tubercle and distally along the second metatarsal axis and tibial crest. The block is pinned to remove 36mm off the more deficient medial  side. Resection is made with an oscillating saw. Size 5is the most appropriate size for the tibia and the proximal tibia is prepared with the modular drill and keel punch for that size.      The femoral sizing guide is placed and size 5 is most appropriate. Rotation is marked off the epicondylar axis and confirmed by creating a rectangular flexion gap at 90 degrees. The size 5 cutting block is pinned in this rotation and the anterior, posterior and chamfer cuts are made with the oscillating saw. The intercondylar block is then  placed and that cut is made.      Trial size 5 tibial component, trial size 5 posterior stabilized femur and a 10  mm posterior stabilized rotating platform insert trial is placed. Full extension is achieved with excellent varus/valgus and anterior/posterior balance throughout full range of motion. The patella is everted and thickness measured to be 22  mm. Free hand resection is taken to 12 mm, a  35 template is placed, lug holes are drilled, trial patella is placed, and it tracks normally. Osteophytes are removed off the posterior femur with the trial in place. All trials are removed and the cut bone surfaces prepared with pulsatile lavage. Cement is mixed and once ready for implantation, the size 5 tibial implant, size  5 posterior stabilized femoral component, and the size 35 patella are cemented in place and the patella is held with the clamp. The trial insert is placed and the knee held in full extension. The Exparel (20 ml mixed with 30 ml saline) is injected into the extensor mechanism, posterior capsule, medial and lateral gutters and subcutaneous tissues.  All extruded cement is removed and once the cement is hard the permanent 10 mm posterior stabilized rotating platform insert is placed into the tibial tray.      The wound is copiously irrigated with saline solution and the extensor mechanism closed over a hemovac drain with #1 V-loc suture. The tourniquet is released for a total tourniquet time of 29  minutes. Flexion against gravity is 140 degrees and the patella tracks normally. Subcutaneous tissue is closed with 2.0 vicryl and subcuticular with running 4.0 Monocryl. The incision is cleaned and dried and steri-strips and a bulky sterile dressing are applied. The limb is placed into a knee immobilizer and the patient is awakened and transported to recovery in stable condition.      Please note that a surgical assistant was a medical necessity for this procedure in order to perform it in a safe and expeditious manner. Surgical assistant was necessary to retract the ligaments and vital neurovascular structures to prevent injury to them and also necessary for proper positioning of the limb to allow for anatomic placement of the prosthesis.   Dione Plover Yamel Bale, MD    05/08/2017, 9:02 AM

## 2017-05-08 NOTE — Anesthesia Procedure Notes (Signed)
Procedure Name: MAC Date/Time: 05/08/2017 8:11 AM Performed by: Dione Booze Pre-anesthesia Checklist: Patient identified, Emergency Drugs available, Suction available and Patient being monitored Patient Re-evaluated:Patient Re-evaluated prior to inductionOxygen Delivery Method: Simple face mask Placement Confirmation: positive ETCO2

## 2017-05-08 NOTE — Anesthesia Preprocedure Evaluation (Signed)
Anesthesia Evaluation  Patient identified by MRN, date of birth, ID band Patient awake    Reviewed: Allergy & Precautions, NPO status , Patient's Chart, lab work & pertinent test results  History of Anesthesia Complications (+) PONV and history of anesthetic complications  Airway Mallampati: II  TM Distance: >3 FB Neck ROM: Full    Dental no notable dental hx.    Pulmonary neg pulmonary ROS,    Pulmonary exam normal breath sounds clear to auscultation       Cardiovascular hypertension, negative cardio ROS Normal cardiovascular exam Rhythm:Regular Rate:Normal     Neuro/Psych negative neurological ROS  negative psych ROS   GI/Hepatic negative GI ROS, Neg liver ROS,   Endo/Other  negative endocrine ROS  Renal/GU negative Renal ROS     Musculoskeletal  (+) Arthritis ,   Abdominal   Peds  Hematology negative hematology ROS (+)   Anesthesia Other Findings   Reproductive/Obstetrics negative OB ROS                             Anesthesia Physical Anesthesia Plan  ASA: II  Anesthesia Plan: Spinal and Regional   Post-op Pain Management:  Regional for Post-op pain   Induction:   Airway Management Planned:   Additional Equipment:   Intra-op Plan:   Post-operative Plan:   Informed Consent: I have reviewed the patients History and Physical, chart, labs and discussed the procedure including the risks, benefits and alternatives for the proposed anesthesia with the patient or authorized representative who has indicated his/her understanding and acceptance.   Dental advisory given  Plan Discussed with: CRNA  Anesthesia Plan Comments:         Anesthesia Quick Evaluation

## 2017-05-08 NOTE — Anesthesia Procedure Notes (Signed)
Spinal  Patient location during procedure: OR Staffing Anesthesiologist: Asmaa Tirpak Performed: anesthesiologist  Preanesthetic Checklist Completed: patient identified, site marked, surgical consent, pre-op evaluation, timeout performed, IV checked, risks and benefits discussed and monitors and equipment checked Spinal Block Patient position: sitting Prep: ChloraPrep and site prepped and draped Patient monitoring: heart rate, continuous pulse ox and blood pressure Location: L3-4 Injection technique: single-shot Needle Needle type: Sprotte  Needle gauge: 24 G Needle length: 9 cm Additional Notes Expiration date of kit checked and confirmed. Patient tolerated procedure well, without complications.       

## 2017-05-08 NOTE — Discharge Instructions (Addendum)
°  ° °Dr. Frank Aluisio °Total Joint Specialist °[facility] °[address] °,  [phone] °[phone] ° °TOTAL KNEE REPLACEMENT POSTOPERATIVE DIRECTIONS ° °Knee Rehabilitation, Guidelines Following Surgery  °Results after knee surgery are often greatly improved when you follow the exercise, range of motion and muscle strengthening exercises prescribed by your doctor. Safety measures are also important to protect the knee from further injury. Any time any of these exercises cause you to have increased pain or swelling in your knee joint, decrease the amount until you are comfortable again and slowly increase them. If you have problems or questions, call your caregiver or physical therapist for advice.  ° °HOME CARE INSTRUCTIONS  °Remove items at home which could result in a fall. This includes throw rugs or furniture in walking pathways.  °· ICE to the affected knee every three hours for 30 minutes at a time and then as needed for pain and swelling.  Continue to use ice on the knee for pain and swelling from surgery. You may notice swelling that will progress down to the foot and ankle.  This is normal after surgery.  Elevate the leg when you are not up walking on it.   °· Continue to use the breathing machine which will help keep your temperature down.  It is common for your temperature to cycle up and down following surgery, especially at night when you are not up moving around and exerting yourself.  The breathing machine keeps your lungs expanded and your temperature down. °· Do not place pillow under knee, focus on keeping the knee straight while resting ° °DIET °You may resume your previous home diet once your are discharged from the hospital. ° °DRESSING / WOUND CARE / SHOWERING °You may shower 3 days after surgery, but keep the wounds dry during showering.  You may use an occlusive plastic wrap (Press'n Seal for example), NO SOAKING/SUBMERGING IN THE BATHTUB.  If the  bandage gets wet, change with a clean dry gauze.  If the incision gets wet, pat the wound dry with a clean towel. °You may start showering once you are discharged home but do not submerge the incision under water. Just pat the incision dry and apply a dry gauze dressing on daily. °Change the surgical dressing daily and reapply a dry dressing each time. ° °ACTIVITY °Walk with your walker as instructed. °Use walker as long as suggested by your caregivers. °Avoid periods of inactivity such as sitting longer than an hour when not asleep. This helps prevent blood clots.  °You may resume a sexual relationship in one month or when given the OK by your doctor.  °You may return to work once you are cleared by your doctor.  °Do not drive a car for 6 weeks or until released by you surgeon.  °Do not drive while taking narcotics. ° °WEIGHT BEARING °Weight bearing as tolerated with assist device (walker, cane, etc) as directed, use it as long as suggested by your surgeon or therapist, typically at least 4-6 weeks. ° °POSTOPERATIVE CONSTIPATION PROTOCOL °Constipation - defined medically as fewer than three stools per week and severe constipation as less than one stool per week. ° °One of the most common issues patients have following surgery is constipation.  Even if you have a regular bowel pattern at home, your normal regimen is likely to be disrupted due to multiple reasons following surgery.  Combination of anesthesia, postoperative narcotics, change in appetite and fluid intake all can affect your   bowels.  In order to avoid complications following surgery, here are some recommendations in order to help you during your recovery period. ° °Colace (docusate) - Pick up an over-the-counter form of Colace or another stool softener and take twice a day as long as you are requiring postoperative pain medications.  Take with a full glass of water daily.  If you experience loose stools or diarrhea, hold the colace until you stool forms  back up.  If your symptoms do not get better within 1 week or if they get worse, check with your doctor. ° °Dulcolax (bisacodyl) - Pick up over-the-counter and take as directed by the product packaging as needed to assist with the movement of your bowels.  Take with a full glass of water.  Use this product as needed if not relieved by Colace only.  ° °MiraLax (polyethylene glycol) - Pick up over-the-counter to have on hand.  MiraLax is a solution that will increase the amount of water in your bowels to assist with bowel movements.  Take as directed and can mix with a glass of water, juice, soda, coffee, or tea.  Take if you go more than two days without a movement. °Do not use MiraLax more than once per day. Call your doctor if you are still constipated or irregular after using this medication for 7 days in a row. ° °If you continue to have problems with postoperative constipation, please contact the office for further assistance and recommendations.  If you experience "the worst abdominal pain ever" or develop nausea or vomiting, please contact the office immediatly for further recommendations for treatment. ° °ITCHING ° If you experience itching with your medications, try taking only a single pain pill, or even half a pain pill at a time.  You can also use Benadryl over the counter for itching or also to help with sleep.  ° °TED HOSE STOCKINGS °Wear the elastic stockings on both legs for three weeks following surgery during the day but you may remove then at night for sleeping. ° °MEDICATIONS °See your medication summary on the “After Visit Summary” that the nursing staff will review with you prior to discharge.  You may have some home medications which will be placed on hold until you complete the course of blood thinner medication.  It is important for you to complete the blood thinner medication as prescribed by your surgeon.  Continue your approved medications as instructed at time of  discharge. ° °PRECAUTIONS °If you experience chest pain or shortness of breath - call 911 immediately for transfer to the hospital emergency department.  °If you develop a fever greater that 101 F, purulent drainage from wound, increased redness or drainage from wound, foul odor from the wound/dressing, or calf pain - CONTACT YOUR SURGEON.   °                                                °FOLLOW-UP APPOINTMENTS °Make sure you keep all of your appointments after your operation with your surgeon and caregivers. You should call the office at the above phone number and make an appointment for approximately two weeks after the date of your surgery or on the date instructed by your surgeon outlined in the "After Visit Summary". ° ° °RANGE OF MOTION AND STRENGTHENING EXERCISES  °Rehabilitation of the knee is important following a knee   injury or an operation. After just a few days of immobilization, the muscles of the thigh which control the knee become weakened and shrink (atrophy). Knee exercises are designed to build up the tone and strength of the thigh muscles and to improve knee motion. Often times heat used for twenty to thirty minutes before working out will loosen up your tissues and help with improving the range of motion but do not use heat for the first two weeks following surgery. These exercises can be done on a training (exercise) mat, on the floor, on a table or on a bed. Use what ever works the best and is most comfortable for you Knee exercises include:  °Leg Lifts - While your knee is still immobilized in a splint or cast, you can do straight leg raises. Lift the leg to 60 degrees, hold for 3 sec, and slowly lower the leg. Repeat 10-20 times 2-3 times daily. Perform this exercise against resistance later as your knee gets better.  °Quad and Hamstring Sets - Tighten up the muscle on the front of the thigh (Quad) and hold for 5-10 sec. Repeat this 10-20 times hourly. Hamstring sets are done by pushing the  foot backward against an object and holding for 5-10 sec. Repeat as with quad sets.  °· Leg Slides: Lying on your back, slowly slide your foot toward your buttocks, bending your knee up off the floor (only go as far as is comfortable). Then slowly slide your foot back down until your leg is flat on the floor again. °· Angel Wings: Lying on your back spread your legs to the side as far apart as you can without causing discomfort.  °A rehabilitation program following serious knee injuries can speed recovery and prevent re-injury in the future due to weakened muscles. Contact your doctor or a physical therapist for more information on knee rehabilitation.  ° °IF YOU ARE TRANSFERRED TO A SKILLED REHAB FACILITY °If the patient is transferred to a skilled rehab facility following release from the hospital, a list of the current medications will be sent to the facility for the patient to continue.  When discharged from the skilled rehab facility, please have the facility set up the patient's Home Health Physical Therapy prior to being released. Also, the skilled facility will be responsible for providing the patient with their medications at time of release from the facility to include their pain medication, the muscle relaxants, and their blood thinner medication. If the patient is still at the rehab facility at time of the two week follow up appointment, the skilled rehab facility will also need to assist the patient in arranging follow up appointment in our office and any transportation needs. ° °MAKE SURE YOU:  °Understand these instructions.  °Get help right away if you are not doing well or get worse.  ° ° °Pick up stool softner and laxative for home use following surgery while on pain medications. °Do not submerge incision under water. °Please use good hand washing techniques while changing dressing each day. °May shower starting three days after surgery. °Please use a clean towel to pat the incision dry following  showers. °Continue to use ice for pain and swelling after surgery. °Do not use any lotions or creams on the incision until instructed by your surgeon. ° °Take Xarelto for two and a half more weeks following discharge from the hospital, then discontinue Xarelto. °Once the patient has completed the blood thinner regimen, then take a Baby 81 mg Aspirin daily   for three more weeks. ° ° °Information on my medicine - XARELTO® (Rivaroxaban) ° ° °Why was Xarelto® prescribed for you? °Xarelto® was prescribed for you to reduce the risk of blood clots forming after orthopedic surgery. The medical term for these abnormal blood clots is venous thromboembolism (VTE). ° °What do you need to know about xarelto® ? °Take your Xarelto® ONCE DAILY at the same time every day. °You may take it either with or without food. ° °If you have difficulty swallowing the tablet whole, you may crush it and mix in applesauce just prior to taking your dose. ° °Take Xarelto® exactly as prescribed by your doctor and DO NOT stop taking Xarelto® without talking to the doctor who prescribed the medication.  Stopping without other VTE prevention medication to take the place of Xarelto® may increase your risk of developing a clot. ° °After discharge, you should have regular check-up appointments with your healthcare provider that is prescribing your Xarelto®.   ° °What do you do if you miss a dose? °If you miss a dose, take it as soon as you remember on the same day then continue your regularly scheduled once daily regimen the next day. Do not take two doses of Xarelto® on the same day.  ° °Important Safety Information °A possible side effect of Xarelto® is bleeding. You should call your healthcare provider right away if you experience any of the following: °? Bleeding from an injury or your nose that does not stop. °? Unusual colored urine (red or dark brown) or unusual colored stools (red or black). °? Unusual bruising for unknown reasons. °? A serious  fall or if you hit your head (even if there is no bleeding). ° °Some medicines may interact with Xarelto® and might increase your risk of bleeding while on Xarelto®. To help avoid this, consult your healthcare provider or pharmacist prior to using any new prescription or non-prescription medications, including herbals, vitamins, non-steroidal anti-inflammatory drugs (NSAIDs) and supplements. ° °This website has more information on Xarelto®: www.xarelto.com. ° ° °

## 2017-05-08 NOTE — Transfer of Care (Signed)
Immediate Anesthesia Transfer of Care Note  Patient: Karen Tucker  Procedure(s) Performed: Procedure(s) with comments: LEFT TOTAL KNEE ARTHROPLASTY (Left) - with block  Patient Location: PACU  Anesthesia Type:MAC, Regional and Spinal  Level of Consciousness: awake, alert  and patient cooperative  Airway & Oxygen Therapy: Patient Spontanous Breathing and Patient connected to face mask oxygen  Post-op Assessment: Report given to RN and Post -op Vital signs reviewed and stable  Post vital signs: Reviewed and stable  Last Vitals:  Vitals:   05/08/17 0744 05/08/17 0746  BP: 132/66   Pulse: 86 86  Resp:    Temp:      Last Pain:  Vitals:   05/08/17 0611  TempSrc: Oral      Patients Stated Pain Goal: 4 (60/73/71 0626)  Complications: No apparent anesthesia complications

## 2017-05-08 NOTE — Interval H&P Note (Signed)
History and Physical Interval Note:  05/08/2017 6:34 AM  Karen Tucker  has presented today for surgery, with the diagnosis of Osteoarthritis Left Knee  The various methods of treatment have been discussed with the patient and family. After consideration of risks, benefits and other options for treatment, the patient has consented to  Procedure(s): LEFT TOTAL KNEE ARTHROPLASTY (Left) as a surgical intervention .  The patient's history has been reviewed, patient examined, no change in status, stable for surgery.  I have reviewed the patient's chart and labs.  Questions were answered to the patient's satisfaction.     Gearlean Alf

## 2017-05-09 ENCOUNTER — Encounter (HOSPITAL_COMMUNITY): Payer: Self-pay | Admitting: Orthopedic Surgery

## 2017-05-09 LAB — CBC
HEMATOCRIT: 33.8 % — AB (ref 36.0–46.0)
Hemoglobin: 11.6 g/dL — ABNORMAL LOW (ref 12.0–15.0)
MCH: 30.9 pg (ref 26.0–34.0)
MCHC: 34.3 g/dL (ref 30.0–36.0)
MCV: 89.9 fL (ref 78.0–100.0)
Platelets: 175 10*3/uL (ref 150–400)
RBC: 3.76 MIL/uL — ABNORMAL LOW (ref 3.87–5.11)
RDW: 13.4 % (ref 11.5–15.5)
WBC: 13 10*3/uL — AB (ref 4.0–10.5)

## 2017-05-09 LAB — BASIC METABOLIC PANEL
ANION GAP: 9 (ref 5–15)
Anion gap: 11 (ref 5–15)
BUN: 12 mg/dL (ref 6–20)
BUN: 11 mg/dL (ref 6–20)
CALCIUM: 8.8 mg/dL — AB (ref 8.9–10.3)
CALCIUM: 8.9 mg/dL (ref 8.9–10.3)
CO2: 23 mmol/L (ref 22–32)
CO2: 25 mmol/L (ref 22–32)
CREATININE: 0.7 mg/dL (ref 0.44–1.00)
CREATININE: 0.79 mg/dL (ref 0.44–1.00)
Chloride: 103 mmol/L (ref 101–111)
Chloride: 101 mmol/L (ref 101–111)
GFR calc non Af Amer: >60 mL/min (ref >60)
Glucose, Bld: 158 mg/dL — ABNORMAL HIGH (ref 65–99)
Glucose, Bld: 204 mg/dL — ABNORMAL HIGH (ref 65–99)
Potassium: 3 mmol/L — ABNORMAL LOW (ref 3.5–5.1)
Potassium: 3.5 mmol/L (ref 3.5–5.1)
SODIUM: 137 mmol/L (ref 135–145)
SODIUM: 135 mmol/L (ref 135–145)

## 2017-05-09 MED ORDER — POTASSIUM CHLORIDE CRYS ER 20 MEQ PO TBCR
40.0000 meq | EXTENDED_RELEASE_TABLET | ORAL | Status: AC
  Administered 2017-05-09: 40 meq via ORAL
  Filled 2017-05-09: qty 2

## 2017-05-09 MED ORDER — SODIUM CHLORIDE 0.9 % IV BOLUS (SEPSIS)
250.0000 mL | Freq: Once | INTRAVENOUS | Status: AC
  Administered 2017-05-09: 250 mL via INTRAVENOUS

## 2017-05-09 MED ORDER — TRAMADOL HCL 50 MG PO TABS: 50.0000 mg | ORAL_TABLET | Freq: Four times a day (QID) | ORAL | 0 refills | Status: DC | PRN

## 2017-05-09 MED ORDER — METHOCARBAMOL 500 MG PO TABS: 500.0000 mg | ORAL_TABLET | Freq: Four times a day (QID) | ORAL | 0 refills | Status: DC | PRN

## 2017-05-09 MED ORDER — HYDROMORPHONE HCL 2 MG PO TABS: 2.0000 mg | ORAL_TABLET | ORAL | 0 refills | Status: DC | PRN

## 2017-05-09 MED ORDER — RIVAROXABAN 10 MG PO TABS: 10.0000 mg | ORAL_TABLET | Freq: Every day | ORAL | 0 refills | Status: DC

## 2017-05-09 MED ORDER — ONDANSETRON HCL 4 MG PO TABS: 4.0000 mg | ORAL_TABLET | Freq: Four times a day (QID) | ORAL | 0 refills | Status: DC | PRN

## 2017-05-09 NOTE — Evaluation (Signed)
Occupational Therapy Evaluation Patient Details Name: Karen Tucker MRN: [id number] DOB: [birth year] Today's Date: 05/09/2017    History of Present Illness Pt is a 66 year old female s/p L TKA with hx of R TKA   Clinical Impression   This 66 y/o F presents with the above. Pt was independent with ADLs and functional mobility at baseline, and currently requires MinGuard assist for functional mobility and MaxA for LB ADLs. Pt will have assist from spouse at home with ADLs PRN after discharge. Pt will benefit from continued OT services while in acute setting to maximize safety and independence with ADLs and functional mobility. Goals are for MinA to supervision.     Follow Up Recommendations  No OT follow up;Supervision/Assistance - 24 hour    Equipment Recommendations  None recommended by OT           Precautions / Restrictions Precautions Precautions: Knee;Fall Required Braces or Orthoses: Knee Immobilizer - Left Restrictions Weight Bearing Restrictions: No Other Position/Activity Restrictions: WBAT      Mobility Bed Mobility Overal bed mobility: Needs Assistance Bed Mobility: Supine to Sit;Sit to Supine     Supine to sit: Min assist Sit to supine: Min assist   General bed mobility comments: assist for  LLE due to pain   Transfers Overall transfer level: Needs assistance Equipment used: Rolling walker (2 wheeled) Transfers: Sit to/from Stand Sit to Stand: Min guard         General transfer comment: verbal cues for UE and LE positioning                                               ADL either performed or assessed with clinical judgement   ADL Overall ADL's : Needs assistance/impaired Eating/Feeding: Independent;Sitting   Grooming: Wash/dry hands;Min guard;Standing   Upper Body Bathing: Min guard;Sitting   Lower Body Bathing: Minimal assistance;Sit to/from stand   Upper Body Dressing : Set up;Sitting   Lower Body Dressing: Maximal  assistance;Sit to/from stand   Toilet Transfer: Min guard;Ambulation;Comfort height toilet;RW   Toileting- Water quality scientist and Hygiene: Min guard;Sit to/from stand   Tub/ Shower Transfer: Minimal assistance;Cueing for sequencing;Rolling walker;Ambulation;Walk-in shower   Functional mobility during ADLs: Min guard;Rolling walker                           Pertinent Vitals/Pain Pain Assessment: Faces Pain Score: 6  Faces Pain Scale: Hurts even more Pain Location: L knee with mobility Pain Descriptors / Indicators: Sore;Aching Pain Intervention(s): Limited activity within patient's tolerance;Monitored during session;Ice applied     Hand Dominance     Extremity/Trunk Assessment Upper Extremity Assessment Upper Extremity Assessment: Overall WFL for tasks assessed           Communication Communication Communication: No difficulties   Cognition Arousal/Alertness: Awake/alert Behavior During Therapy: WFL for tasks assessed/performed Overall Cognitive Status: Within Functional Limits for tasks assessed                                     General Comments            Home Living Family/patient expects to be discharged to:: Private residence Living Arrangements: Spouse/significant other Available Help at Discharge: Family;Available 24 hours/day Type of Home: [place]  Access: Level entry     Home Layout: Able to live on main level with bedroom/bathroom     Bathroom Shower/Tub: Walk-in shower   Bathroom Toilet: Handicapped height     Home Equipment: Environmental consultant - 2 wheels;Shower seat - built in          Prior Functioning/Environment Level of Independence: Independent                 OT Problem List: Decreased activity tolerance;Pain;Decreased strength;Decreased knowledge of use of DME or AE      OT Treatment/Interventions: Self-care/ADL training;Therapeutic activities;DME and/or AE instruction;Patient/family education    OT  Goals(Current goals can be found in the care plan section) Acute Rehab OT Goals Patient Stated Goal: return to independence OT Goal Formulation: With patient Time For Goal Achievement: 05/16/17 Potential to Achieve Goals: Good ADL Goals Pt Will Perform Grooming: with supervision;standing Pt Will Perform Lower Body Dressing: with min assist;with adaptive equipment;sit to/from stand Pt Will Transfer to Toilet: with supervision;ambulating;regular height toilet Pt Will Perform Tub/Shower Transfer: with min guard assist;Shower transfer;ambulating;rolling walker  OT Frequency: Min 2X/week                             AM-PAC PT "6 Clicks" Daily Activity     Outcome Measure Help from another person eating meals?: None Help from another person taking care of personal grooming?: A Little Help from another person toileting, which includes using toliet, bedpan, or urinal?: A Little Help from another person bathing (including washing, rinsing, drying)?: A Little Help from another person to put on and taking off regular upper body clothing?: A Little Help from another person to put on and taking off regular lower body clothing?: A Lot 6 Click Score: 18   End of Session Equipment Utilized During Treatment: Gait belt;Rolling walker  Activity Tolerance: Patient tolerated treatment well Patient left: in bed;with call bell/phone within reach;with family/visitor present  OT Visit Diagnosis: Pain;Muscle weakness (generalized) (M62.81) Pain - Right/Left: Left Pain - part of body: Knee                Time: 1333-1405 OT Time Calculation (min): 32 min Charges:  OT General Charges $OT Visit: 1 Procedure OT Evaluation $OT Eval Low Complexity: 1 Procedure OT Treatments $Self Care/Home Management : 8-22 mins G-Codes:     Lou Cal, OT Pager [phone] 05/09/2017   [name] 05/09/2017, 3:09 PM

## 2017-05-09 NOTE — Progress Notes (Signed)
Physical Therapy Treatment Patient Details Name: Karen Tucker MRN: [id number] DOB: [birth year] Today's Date: 05/09/2017    History of Present Illness Pt is a 66 year old female s/p L TKA with hx of R TKA    PT Comments    Pt with nausea and emesis this morning however agreeable to perform exercises.  Pt still with nausea during exercises so will attempt ambulating this afternoon.  RN into room end of session to give nausea med.  Follow Up Recommendations  DC plan and follow up therapy as arranged by surgeon;Outpatient PT     Equipment Recommendations  None recommended by PT    Recommendations for Other Services       Precautions / Restrictions Precautions Precautions: Knee;Fall Required Braces or Orthoses: Knee Immobilizer - Left Restrictions Other Position/Activity Restrictions: WBAT    Mobility  Bed Mobility               General bed mobility comments: pt up in recliner, reports nausea and emesis earlier with NT after up to bathroom  Transfers                    Ambulation/Gait                 Stairs            Wheelchair Mobility    Modified Rankin (Stroke Patients Only)       Balance                                            Cognition Arousal/Alertness: Awake/alert Behavior During Therapy: WFL for tasks assessed/performed Overall Cognitive Status: Within Functional Limits for tasks assessed                                        Exercises Total Joint Exercises Ankle Circles/Pumps: AROM;Both;10 reps Quad Sets: AROM;Left;10 reps Short Arc QuadSinclair Ship;Left;10 reps Heel Slides: AAROM;Left;10 reps Hip ABduction/ADduction: AROM;Left;10 reps Straight Leg Raises: AAROM;Left;10 reps Goniometric ROM: approx 35* AAROM knee flexion, limited by pain    General Comments        Pertinent Vitals/Pain Pain Assessment: 0-10 Pain Score: 7  Pain Location: L knee Pain Descriptors / Indicators:  Sore;Aching Pain Intervention(s): Limited activity within patient's tolerance;Monitored during session;Repositioned;Ice applied;Premedicated before session    Home Living                      Prior Function            PT Goals (current goals can now be found in the care plan section) Progress towards PT goals: Progressing toward goals    Frequency    7X/week      PT Plan      Co-evaluation              AM-PAC PT "6 Clicks" Daily Activity  Outcome Measure  Difficulty turning over in bed (including adjusting bedclothes, sheets and blankets)?: None Difficulty moving from lying on back to sitting on the side of the bed? : A Little Difficulty sitting down on and standing up from a chair with arms (e.g., wheelchair, bedside commode, etc,.)?: A Little Help needed moving to and from a bed to chair (including a wheelchair)?: A Little Help needed  walking in hospital room?: A Little Help needed climbing 3-5 steps with a railing? : A Little 6 Click Score: 19    End of Session   Activity Tolerance: Other (comment) (limited by nausea) Patient left: in chair;with call bell/phone within reach;with nursing/sitter in room Nurse Communication: Other (comment) (RN giving nausea med upon leaving room) PT Visit Diagnosis: Other abnormalities of gait and mobility (R26.89);Pain Pain - Right/Left: Left Pain - part of body: Knee     Time: 5830-9407 PT Time Calculation (min) (ACUTE ONLY): 14 min  Charges:  $Therapeutic Exercise: 8-22 mins                    G Codes:       Carmelia Bake, PT, DPT 05/09/2017 Pager: [phone]    York Ram E 05/09/2017, 1:03 PM

## 2017-05-09 NOTE — Progress Notes (Signed)
   05/09/17 1400  PT Visit Information  Last PT Received On 05/09/17  Assistance Needed +1  History of Present Illness Pt is a 66 year old female s/p L TKA with hx of R TKA  Subjective Data  Subjective Pt reports feeling better this afternoon and tolerated ambulating in hallway well.  Precautions  Precautions Knee;Fall  Required Braces or Orthoses Knee Immobilizer - Left  Restrictions  Other Position/Activity Restrictions WBAT  Pain Assessment  Pain Assessment 0-10  Pain Score 6  Pain Location L knee with mobility  Pain Descriptors / Indicators Sore;Aching  Pain Intervention(s) Monitored during session;Limited activity within patient's tolerance;Repositioned;Ice applied  Cognition  Arousal/Alertness Awake/alert  Behavior During Therapy WFL for tasks assessed/performed  Overall Cognitive Status Within Functional Limits for tasks assessed  Bed Mobility  Overal bed mobility Needs Assistance  Bed Mobility Supine to Sit;Sit to Supine  Supine to sit Min assist  Sit to supine Min assist  General bed mobility comments assist for pain control  Transfers  Overall transfer level Needs assistance  Equipment used Rolling walker (2 wheeled)  Transfers Sit to/from Stand  Sit to Stand Min guard  General transfer comment verbal cues for UE and LE positioning  Ambulation/Gait  Ambulation/Gait assistance Min guard  Ambulation Distance (Feet) 80 Feet  Assistive device Rolling walker (2 wheeled)  Gait Pattern/deviations Step-to pattern;Decreased stance time - left;Antalgic  General Gait Details verbal cues for sequence, RW positioning, step length  PT - End of Session  Equipment Utilized During Treatment Gait belt;Left knee immobilizer  Activity Tolerance Patient tolerated treatment well  Patient left in bed;with call bell/phone within reach;with family/visitor present  PT - Assessment/Plan  PT Plan Current plan remains appropriate  PT Visit Diagnosis Other abnormalities of gait and mobility  (R26.89);Pain  Pain - Right/Left Left  Pain - part of body Knee  PT Frequency (ACUTE ONLY) 7X/week  Follow Up Recommendations DC plan and follow up therapy as arranged by surgeon;Outpatient PT  PT equipment None recommended by PT  AM-PAC PT "6 Clicks" Daily Activity Outcome Measure  Difficulty turning over in bed (including adjusting bedclothes, sheets and blankets)? 4  Difficulty moving from lying on back to sitting on the side of the bed?  3  Difficulty sitting down on and standing up from a chair with arms (e.g., wheelchair, bedside commode, etc,.)? 3  Help needed moving to and from a bed to chair (including a wheelchair)? 3  Help needed walking in hospital room? 3  Help needed climbing 3-5 steps with a railing?  3  6 Click Score 19  Mobility G Code  CJ  PT Goal Progression  Progress towards PT goals Progressing toward goals  PT Time Calculation  PT Start Time (ACUTE ONLY) 1418  PT Stop Time (ACUTE ONLY) 1436  PT Time Calculation (min) (ACUTE ONLY) 18 min  PT General Charges  $$ ACUTE PT VISIT 1 Procedure  PT Treatments  $Gait Training 8-22 mins   Carmelia Bake, PT, DPT 05/09/2017 Pager: [phone]

## 2017-05-09 NOTE — Progress Notes (Signed)
   Subjective: 1 Day Post-Op Procedure(s) (LRB): LEFT TOTAL KNEE ARTHROPLASTY (Left) Patient reports pain as mild.   Patient seen in rounds with Dr. Wynelle Link.  Family in room.  No nausea this morning. Patient is well, but has had some minor complaints of pain in the knee, requiring pain medications We will start therapy today.  Plan is to go Home after hospital stay.  Objective: Vital signs in last 24 hours: Temp:  [97.6 F (36.4 C)-98.6 F (37 C)] 98 F (36.7 C) (05/15 0655) Pulse Rate:  [55-84] 55 (05/15 0655) Resp:  [12-24] 14 (05/15 0655) BP: (100-117)/(51-65) 114/65 (05/15 0655) SpO2:  [94 %-100 %] 99 % (05/15 0655) Weight:  [78.9 kg (174 lb)] 78.9 kg (174 lb) (05/14 1040)  Intake/Output from previous day:  Intake/Output Summary (Last 24 hours) at 05/09/17 0814 Last data filed at 05/09/17 0634  Gross per 24 hour  Intake          4276.25 ml  Output             3250 ml  Net          1026.25 ml    Intake/Output this shift: No intake/output data recorded.  Labs:  Recent Labs  05/09/17 0433  HGB 11.6*    Recent Labs  05/09/17 0433  WBC 13.0*  RBC 3.76*  HCT 33.8*  PLT 175    Recent Labs  05/09/17 0433  NA 137  K 3.0*  CL 103  CO2 23  BUN 12  CREATININE 0.70  GLUCOSE 158*  CALCIUM 8.8*   No results for input(s): LABPT, INR in the last 72 hours.  EXAM General - Patient is Alert, Appropriate and Oriented Extremity - Neurovascular intact Sensation intact distally Intact pulses distally Dorsiflexion/Plantar flexion intact Dressing - dressing C/D/I Motor Function - intact, moving foot and toes well on exam.  Hemovac pulled without difficulty.  Past Medical History:  Diagnosis Date  . Gout   . Hypertension   . PONV (postoperative nausea and vomiting)     Assessment/Plan: 1 Day Post-Op Procedure(s) (LRB): LEFT TOTAL KNEE ARTHROPLASTY (Left) Principal Problem:   OA (osteoarthritis) of knee  Estimated body mass index is 31.83 kg/m as  calculated from the following:   Height as of this encounter: 5\' 2"  (1.575 m).   Weight as of this encounter: 78.9 kg (174 lb). Up with therapy Plan for discharge tomorrow  DVT Prophylaxis - Xarelto Weight-Bearing as tolerated to left leg D/C O2 and Pulse OX and try on Room Air  Plan home tomorrow. Straight to outpatient therapy at [facility] on 5/18. K+ 3.0 postop - Already on potassium at home. Recheck this afternoon. Soft pressures - additional fluids this morning.  Good output. Parameter on BP pill  Arlee Muslim, PA-C Orthopaedic Surgery 05/09/2017, 8:14 AM

## 2017-05-09 NOTE — Discharge Summary (Signed)
Physician Discharge Summary   Patient ID: Karen Tucker MRN: [id number] DOB/AGE: 68-06-1951 68 y.o.  Admit date: 05/08/2017 Discharge date: 05/10/2017  Primary Diagnosis:  Osteoarthritis  Left knee(s) Admission Diagnoses:  Past Medical History:  Diagnosis Date  . Gout   . Hypertension   . PONV (postoperative nausea and vomiting)    Discharge Diagnoses:   Principal Problem:   OA (osteoarthritis) of knee  Estimated body mass index is 31.83 kg/m as calculated from the following:   Height as of this encounter: _0  (1.575 m).   Weight as of this encounter: 78.9 kg (174 lb).  Procedure:  Procedure(s) (LRB): LEFT TOTAL KNEE ARTHROPLASTY (Left)   Consults: None  HPI: Karen Tucker is a 66 y.o. year old female with end stage OA of her left knee with progressively worsening pain and dysfunction. She has constant pain, with activity and at rest and significant functional deficits with difficulties even with ADLs. She has had extensive non-op management including analgesics, injections of cortisone and viscosupplements, and home exercise program, but remains in significant pain with significant dysfunction. Radiographs show bone on bone arthritis medial and patellofemoral. She presents now for left Total Knee Arthroplasty.   Laboratory Data: Admission on 05/08/2017  Component Date Value Ref Range Status  . WBC 05/09/2017 13.0* 4.0 - 10.5 K/uL Final  . RBC 05/09/2017 3.76* 3.87 - 5.11 MIL/uL Final  . Hemoglobin 05/09/2017 11.6* 12.0 - 15.0 g/dL Final  . HCT 05/09/2017 33.8* 36.0 - 46.0 % Final  . MCV 05/09/2017 89.9  78.0 - 100.0 fL Final  . MCH 05/09/2017 30.9  26.0 - 34.0 pg Final  . MCHC 05/09/2017 34.3  30.0 - 36.0 g/dL Final  . RDW 05/09/2017 13.4  11.5 - 15.5 % Final  . Platelets 05/09/2017 175  150 - 400 K/uL Final  . Sodium 05/09/2017 137  135 - 145 mmol/L Final  . Potassium 05/09/2017 3.0* 3.5 - 5.1 mmol/L Final  . Chloride 05/09/2017 103  101 - 111 mmol/L Final  . CO2  05/09/2017 23  22 - 32 mmol/L Final  . Glucose, Bld 05/09/2017 158* 65 - 99 mg/dL Final  . BUN 05/09/2017 12  6 - 20 mg/dL Final  . Creatinine, Ser 05/09/2017 0.70  0.44 - 1.00 mg/dL Final  . Calcium 05/09/2017 8.8* 8.9 - 10.3 mg/dL Final  . GFR calc non Af Amer 05/09/2017 >60  >60 mL/min Final  . GFR calc Af Amer 05/09/2017 >60  >60 mL/min Final   Comment: (NOTE) The eGFR has been calculated using the CKD EPI equation. This calculation has not been validated in all clinical situations. eGFR's persistently <60 mL/min signify possible Chronic Kidney Disease.   . Anion gap 05/09/2017 11  5 - 15 Final  . Sodium 05/09/2017 135  135 - 145 mmol/L Final  . Potassium 05/09/2017 3.5  3.5 - 5.1 mmol/L Final  . Chloride 05/09/2017 101  101 - 111 mmol/L Final  . CO2 05/09/2017 25  22 - 32 mmol/L Final  . Glucose, Bld 05/09/2017 204* 65 - 99 mg/dL Final  . BUN 05/09/2017 11  6 - 20 mg/dL Final  . Creatinine, Ser 05/09/2017 0.79  0.44 - 1.00 mg/dL Final  . Calcium 05/09/2017 8.9  8.9 - 10.3 mg/dL Final  . GFR calc non Af Amer 05/09/2017 >60  >60 mL/min Final  . GFR calc Af Amer 05/09/2017 >60  >60 mL/min Final   Comment: (NOTE) The eGFR has been calculated using the CKD EPI equation.  This calculation has not been validated in all clinical situations. eGFR's persistently <60 mL/min signify possible Chronic Kidney Disease.   Georgiann Hahn gap 05/09/2017 9  5 - 15 Final  Hospital Outpatient Visit on 04/28/2017  Component Date Value Ref Range Status  . aPTT 04/28/2017 29  24 - 36 seconds Final  . WBC 04/28/2017 7.2  4.0 - 10.5 K/uL Final  . RBC 04/28/2017 4.43  3.87 - 5.11 MIL/uL Final  . Hemoglobin 04/28/2017 13.8  12.0 - 15.0 g/dL Final  . HCT 04/28/2017 40.2  36.0 - 46.0 % Final  . MCV 04/28/2017 90.7  78.0 - 100.0 fL Final  . MCH 04/28/2017 31.2  26.0 - 34.0 pg Final  . MCHC 04/28/2017 34.3  30.0 - 36.0 g/dL Final  . RDW 04/28/2017 13.5  11.5 - 15.5 % Final  . Platelets 04/28/2017 193  150 -  400 K/uL Final  . Sodium 04/28/2017 141  135 - 145 mmol/L Final  . Potassium 04/28/2017 3.5  3.5 - 5.1 mmol/L Final  . Chloride 04/28/2017 106  101 - 111 mmol/L Final  . CO2 04/28/2017 27  22 - 32 mmol/L Final  . Glucose, Bld 04/28/2017 119* 65 - 99 mg/dL Final  . BUN 04/28/2017 13  6 - 20 mg/dL Final  . Creatinine, Ser 04/28/2017 0.66  0.44 - 1.00 mg/dL Final  . Calcium 04/28/2017 9.6  8.9 - 10.3 mg/dL Final  . Total Protein 04/28/2017 7.2  6.5 - 8.1 g/dL Final  . Albumin 04/28/2017 4.4  3.5 - 5.0 g/dL Final  . AST 04/28/2017 26  15 - 41 U/L Final  . ALT 04/28/2017 19  14 - 54 U/L Final  . Alkaline Phosphatase 04/28/2017 72  38 - 126 U/L Final  . Total Bilirubin 04/28/2017 1.6* 0.3 - 1.2 mg/dL Final  . GFR calc non Af Amer 04/28/2017 >60  >60 mL/min Final  . GFR calc Af Amer 04/28/2017 >60  >60 mL/min Final   Comment: (NOTE) The eGFR has been calculated using the CKD EPI equation. This calculation has not been validated in all clinical situations. eGFR's persistently <60 mL/min signify possible Chronic Kidney Disease.   . Anion gap 04/28/2017 8  5 - 15 Final  . Prothrombin Time 04/28/2017 13.2  11.4 - 15.2 seconds Final  . INR 04/28/2017 1.00   Final  . ABO/RH(D) 04/28/2017 O POS   Final  . Antibody Screen 04/28/2017 NEG   Final  . Sample Expiration 04/28/2017 05/11/2017   Final  . Extend sample reason 04/28/2017 NO TRANSFUSIONS OR PREGNANCY IN THE PAST 3 MONTHS   Final  . MRSA, PCR 04/28/2017 NEGATIVE  NEGATIVE Final  . Staphylococcus aureus 04/28/2017 NEGATIVE  NEGATIVE Final   Comment:        The Xpert SA Assay (FDA approved for NASAL specimens in patients over 71 years of age), is one component of a comprehensive surveillance program.  Test performance has been validated by [facility] for patients greater than or equal to 75 year old. It is not intended to diagnose infection nor to guide or monitor treatment.      X-Rays:No results found.  EKG: Orders placed or  performed during the hospital encounter of 01/27/14  . EKG 12-Lead  . EKG 12-Lead  . EKG 12-Lead  . EKG 12-Lead  . EKG     Hospital Course: Karen Tucker is a 66 y.o. who was admitted to [facility]. They were brought to the operating room on 05/08/2017  and underwent Procedure(s): LEFT TOTAL KNEE ARTHROPLASTY.  Patient tolerated the procedure well and was later transferred to the recovery room and then to the orthopaedic floor for postoperative care.  They were given PO and IV analgesics for pain control following their surgery.  They were given 24 hours of postoperative antibiotics of  Anti-infectives    Start     Dose/Rate Route Frequency Ordered Stop   05/08/17 1400  ceFAZolin (ANCEF) IVPB 2g/100 mL premix     2 g 200 mL/hr over 30 Minutes Intravenous Every 6 hours 05/08/17 1050 05/08/17 2015   05/08/17 0624  ceFAZolin (ANCEF) 2-4 GM/100ML-% IVPB    Comments:  [place] Session   : cabinet override      05/08/17 6010 05/08/17 1829   05/08/17 0609  ceFAZolin (ANCEF) IVPB 2g/100 mL premix     2 g 200 mL/hr over 30 Minutes Intravenous On call to O.R. 05/08/17 9323 05/08/17 0827     and started on DVT prophylaxis in the form of Xarelto.   PT and OT were ordered for total joint protocol.  Discharge planning consulted to help with postop disposition and equipment needs.  Patient had a tough night on the evening of surgery.  They started to get up OOB with therapy on day one. Hemovac drain was pulled without difficulty.  Continued to work with therapy into day two.  Dressing was changed on day two and the incision was healing well.  Patient was seen in rounds and setup to go home later that same afternoon.   Diet - Cardiac diet Follow up - in 2 weeks Activity - WBAT Disposition - Home Condition Upon Discharge - Good D/C Meds - See DC Summary DVT Prophylaxis - Xarelto  Discharge Instructions    Call MD / Call 911    Complete by:  As directed    If you experience chest pain or  shortness of breath, CALL 911 and be transported to the hospital emergency room.  If you develope a fever above 101 F, pus (white drainage) or increased drainage or redness at the wound, or calf pain, call your surgeon's office.   Change dressing    Complete by:  As directed    Change dressing daily with sterile 4 x 4 inch gauze dressing and apply TED hose. Do not submerge the incision under water.   Constipation Prevention    Complete by:  As directed    Drink plenty of fluids.  Prune juice may be helpful.  You may use a stool softener, such as Colace (over the counter) 100 mg twice a day.  Use MiraLax (over the counter) for constipation as needed.   Diet - low sodium heart healthy    Complete by:  As directed    Discharge instructions    Complete by:  As directed    Pick up stool softner and laxative for home use following surgery while on pain medications. Do not submerge incision under water. Please use good hand washing techniques while changing dressing each day. May shower starting three days after surgery. Please use a clean towel to pat the incision dry following showers. Continue to use ice for pain and swelling after surgery. Do not use any lotions or creams on the incision until instructed by your surgeon.  Wear both TED hose on both legs during the day every day for three weeks, but may have off at night at home.  Postoperative Constipation Protocol  Constipation - defined medically as fewer than  three stools per week and severe constipation as less than one stool per week.  One of the most common issues patients have following surgery is constipation.  Even if you have a regular bowel pattern at home, your normal regimen is likely to be disrupted due to multiple reasons following surgery.  Combination of anesthesia, postoperative narcotics, change in appetite and fluid intake all can affect your bowels.  In order to avoid complications following surgery, here are some  recommendations in order to help you during your recovery period.  Colace (docusate) - Pick up an over-the-counter form of Colace or another stool softener and take twice a day as long as you are requiring postoperative pain medications.  Take with a full glass of water daily.  If you experience loose stools or diarrhea, hold the colace until you stool forms back up.  If your symptoms do not get better within 1 week or if they get worse, check with your doctor.  Dulcolax (bisacodyl) - Pick up over-the-counter and take as directed by the product packaging as needed to assist with the movement of your bowels.  Take with a full glass of water.  Use this product as needed if not relieved by Colace only.   MiraLax (polyethylene glycol) - Pick up over-the-counter to have on hand.  MiraLax is a solution that will increase the amount of water in your bowels to assist with bowel movements.  Take as directed and can mix with a glass of water, juice, soda, coffee, or tea.  Take if you go more than two days without a movement. Do not use MiraLax more than once per day. Call your doctor if you are still constipated or irregular after using this medication for 7 days in a row.  If you continue to have problems with postoperative constipation, please contact the office for further assistance and recommendations.  If you experience "the worst abdominal pain ever" or develop nausea or vomiting, please contact the office immediatly for further recommendations for treatment.   Take Xarelto for two and a half more weeks, then discontinue Xarelto. Once the patient has completed the blood thinner regimen, then take a Baby 81 mg Aspirin daily for three more weeks.   Do not put a pillow under the knee. Place it under the heel.    Complete by:  As directed    Do not sit on low chairs, stoools or toilet seats, as it may be difficult to get up from low surfaces    Complete by:  As directed    Driving restrictions    Complete  by:  As directed    No driving until released by the physician.   Increase activity slowly as tolerated    Complete by:  As directed    Lifting restrictions    Complete by:  As directed    No lifting until released by the physician.   Patient may shower    Complete by:  As directed    You may shower without a dressing once there is no drainage.  Do not wash over the wound.  If drainage remains, do not shower until drainage stops.   TED hose    Complete by:  As directed    Use stockings (TED hose) for 3 weeks on both leg(s).  You may remove them at night for sleeping.   Weight bearing as tolerated    Complete by:  As directed    Laterality:  left   Extremity:  Lower  Allergies as of 05/09/2017      Reactions   Codeine Nausea And Vomiting   Lisinopril Cough   Morphine And Related Nausea And Vomiting      Medication List    STOP taking these medications   ANALGESIC CREME RUB/ALOE EX     TAKE these medications   acetaminophen 500 MG tablet Commonly known as:  TYLENOL Take 500 mg by mouth 2 (two) times daily as needed for moderate pain.   allopurinol 300 MG tablet Commonly known as:  ZYLOPRIM Take 300 mg by mouth daily.   HYDROmorphone 2 MG tablet Commonly known as:  DILAUDID Take 1-2 tablets (2-4 mg total) by mouth every 4 (four) hours as needed for severe pain.   losartan 100 MG tablet Commonly known as:  COZAAR Take 100 mg by mouth daily.   methocarbamol 500 MG tablet Commonly known as:  ROBAXIN Take 1 tablet (500 mg total) by mouth every 6 (six) hours as needed for muscle spasms.   ondansetron 4 MG tablet Commonly known as:  ZOFRAN Take 1 tablet (4 mg total) by mouth every 6 (six) hours as needed for nausea.   potassium chloride 10 MEQ tablet Commonly known as:  K-DUR Take 50 mEq by mouth daily. Takes 5 tablets daily   rivaroxaban 10 MG Tabs tablet Commonly known as:  XARELTO Take 1 tablet (10 mg total) by mouth daily with breakfast. Take Xarelto for  two and a half more weeks following discharge from the hospital, then discontinue Xarelto. Once the patient has completed the blood thinner regimen, then take a Baby 81 mg Aspirin daily for three more weeks. Start taking on:  05/10/2017   traMADol 50 MG tablet Commonly known as:  ULTRAM Take 1-2 tablets (50-100 mg total) by mouth every 6 (six) hours as needed for moderate pain.      Follow-up Information    Gaynelle Arabian, MD. Schedule an appointment as soon as possible for a visit on 05/23/2017.   Specialty:  Orthopedic Surgery Contact information: [address] [place] [address] [phone]           Signed: Arlee Muslim, PA-C Orthopaedic Surgery 05/09/2017, 9:31 PM

## 2017-05-09 NOTE — Progress Notes (Signed)
Lives with spouse in a 3 story home with 0 steps. No DME needs, has a RW and BSC. Scheduled to start OP PT on 05-12-17. [phone]

## 2017-05-10 LAB — CBC
HCT: 34.1 % — ABNORMAL LOW (ref 36.0–46.0)
Hemoglobin: 11.5 g/dL — ABNORMAL LOW (ref 12.0–15.0)
MCH: 30.4 pg (ref 26.0–34.0)
MCHC: 33.7 g/dL (ref 30.0–36.0)
MCV: 90.2 fL (ref 78.0–100.0)
Platelets: 189 10*3/uL (ref 150–400)
RBC: 3.78 MIL/uL — ABNORMAL LOW (ref 3.87–5.11)
RDW: 13.6 % (ref 11.5–15.5)
WBC: 12.6 10*3/uL — ABNORMAL HIGH (ref 4.0–10.5)

## 2017-05-10 LAB — BASIC METABOLIC PANEL
Anion gap: 9 (ref 5–15)
BUN: 15 mg/dL (ref 6–20)
CO2: 27 mmol/L (ref 22–32)
Calcium: 9 mg/dL (ref 8.9–10.3)
Chloride: 100 mmol/L — ABNORMAL LOW (ref 101–111)
Creatinine, Ser: 0.77 mg/dL (ref 0.44–1.00)
GFR calc Af Amer: >60 mL/min (ref >60)
GFR calc non Af Amer: >60 mL/min (ref >60)
Glucose, Bld: 121 mg/dL — ABNORMAL HIGH (ref 65–99)
Potassium: 3.3 mmol/L — ABNORMAL LOW (ref 3.5–5.1)
Sodium: 136 mmol/L (ref 135–145)

## 2017-05-10 NOTE — Progress Notes (Signed)
   05/10/17 1600  PT Visit Information  Last PT Received On 05/10/17  Assistance Needed +1  History of Present Illness Pt is a 66 year old female s/p L TKA with hx of R TKA  Subjective Data  Subjective Pt still presents with anxiety this afternoon however appears improved.  Spouse assisted with donning KI and transfers to practice for technique to better assist once home.  Pt ambulated in hallway and performed LE exercises.  HEP handout provided.  Pt and spouse had no further questions.  Precautions  Precautions Knee;Fall  Required Braces or Orthoses Knee Immobilizer - Left  Restrictions  Other Position/Activity Restrictions WBAT  Pain Assessment  Pain Score 4  Pain Location L knee with mobility  Pain Descriptors / Indicators Aching;Sore  Pain Intervention(s) Limited activity within patient's tolerance;Monitored during session;Repositioned  Cognition  Arousal/Alertness Awake/alert  Behavior During Therapy WFL for tasks assessed/performed  Overall Cognitive Status Within Functional Limits for tasks assessed  Bed Mobility  Overal bed mobility Needs Assistance  Bed Mobility Supine to Sit;Sit to Supine  Supine to sit Min assist  Sit to supine Min assist  General bed mobility comments Assist for L LE   Transfers  Overall transfer level Needs assistance  Equipment used Rolling walker (2 wheeled)  Transfers Sit to/from Stand  Sit to Stand Min guard  General transfer comment verbal cues for UE and LE positioning  Ambulation/Gait  Ambulation/Gait assistance Min guard  Ambulation Distance (Feet) 80 Feet  Assistive device Rolling walker (2 wheeled)  Gait Pattern/deviations Step-to pattern;Decreased stance time - left;Antalgic  General Gait Details verbal cues for sequence, RW positioning, step length  Total Joint Exercises  Ankle Circles/Pumps AROM;Both;10 reps  Quad Sets AROM;Left;10 reps  Short [place];Left;10 reps  Heel Slides AAROM;Left;10 reps  Hip ABduction/ADduction  Left;10 reps;AAROM  Straight Leg Raises AAROM;Left;10 reps  PT - End of Session  Equipment Utilized During Treatment Left knee immobilizer  Activity Tolerance Patient tolerated treatment well  Patient left in bed;with call bell/phone within reach;with family/visitor present  PT - Assessment/Plan  PT Plan Current plan remains appropriate  PT Visit Diagnosis Other abnormalities of gait and mobility (R26.89);Pain  Pain - Right/Left Left  Pain - part of body Knee  PT Frequency (ACUTE ONLY) 7X/week  Follow Up Recommendations DC plan and follow up therapy as arranged by surgeon;Outpatient PT  PT equipment None recommended by PT  AM-PAC PT "6 Clicks" Daily Activity Outcome Measure  Difficulty turning over in bed (including adjusting bedclothes, sheets and blankets)? 4  Difficulty moving from lying on back to sitting on the side of the bed?  3  Difficulty sitting down on and standing up from a chair with arms (e.g., wheelchair, bedside commode, etc,.)? 3  Help needed moving to and from a bed to chair (including a wheelchair)? 3  Help needed walking in hospital room? 3  Help needed climbing 3-5 steps with a railing?  3  6 Click Score 19  Mobility G Code  CJ  PT Goal Progression  Progress towards PT goals Progressing toward goals  PT Time Calculation  PT Start Time (ACUTE ONLY) 1337  PT Stop Time (ACUTE ONLY) 1403  PT Time Calculation (min) (ACUTE ONLY) 26 min  PT General Charges  $$ ACUTE PT VISIT 1 Procedure  PT Treatments  $Gait Training 8-22 mins  $Therapeutic Exercise 8-22 mins   Carmelia Bake, PT, DPT 05/10/2017 Pager: [phone]

## 2017-05-10 NOTE — Progress Notes (Signed)
   Subjective: 2 Days Post-Op Procedure(s) (LRB): LEFT TOTAL KNEE ARTHROPLASTY (Left) Patient reports pain as mild.   Patient seen in rounds for Dr. Wynelle Link.  Still had some intermittent nausea yesterday and early this morning. Possible home this afternoon if nausea resolves and meets goals.  Probably this afternoon. Patient is well, but has had some minor complaints of pain in the knee, requiring pain medications Patient is ready to go home probably this afternoon if doing better.  Objective: Vital signs in last 24 hours: Temp:  [98.1 F (36.7 C)-98.6 F (37 C)] 98.5 F (36.9 C) (05/16 0537) Pulse Rate:  [71-95] 89 (05/16 0537) Resp:  [14-16] 16 (05/16 0537) BP: (118-132)/(63-72) 118/72 (05/16 0537) SpO2:  [92 %-96 %] 92 % (05/16 0537)  Intake/Output from previous day:  Intake/Output Summary (Last 24 hours) at 05/10/17 0801 Last data filed at 05/10/17 0538  Gross per 24 hour  Intake          1321.75 ml  Output             2400 ml  Net         -1078.25 ml    Intake/Output this shift: No intake/output data recorded.  Labs:  Recent Labs  05/09/17 0433 05/10/17 0429  HGB 11.6* 11.5*    Recent Labs  05/09/17 0433 05/10/17 0429  WBC 13.0* 12.6*  RBC 3.76* 3.78*  HCT 33.8* 34.1*  PLT 175 189    Recent Labs  05/09/17 1407 05/10/17 0429  NA 135 136  K 3.5 3.3*  CL 101 100*  CO2 25 27  BUN 11 15  CREATININE 0.79 0.77  GLUCOSE 204* 121*  CALCIUM 8.9 9.0   No results for input(s): LABPT, INR in the last 72 hours.  EXAM: General - Patient is Alert, Appropriate and Oriented Extremity - Neurovascular intact Sensation intact distally Intact pulses distally Dorsiflexion/Plantar flexion intact Incision - clean, dry, no drainage Motor Function - intact, moving foot and toes well on exam.   Assessment/Plan: 2 Days Post-Op Procedure(s) (LRB): LEFT TOTAL KNEE ARTHROPLASTY (Left) Procedure(s) (LRB): LEFT TOTAL KNEE ARTHROPLASTY (Left) Past Medical History:    Diagnosis Date  . Gout   . Hypertension   . PONV (postoperative nausea and vomiting)    Principal Problem:   OA (osteoarthritis) of knee  Estimated body mass index is 31.83 kg/m as calculated from the following:   Height as of this encounter: 5\' 2"  (1.575 m).   Weight as of this encounter: 78.9 kg (174 lb). Up with therapy Diet - Cardiac diet Follow up - in 2 weeks Activity - WBAT Disposition - Home Condition Upon Discharge - Good D/C Meds - See DC Summary DVT Prophylaxis - Xarelto  Arlee Muslim, PA-C Orthopaedic Surgery 05/10/2017, 8:01 AM

## 2017-05-10 NOTE — Progress Notes (Signed)
Physical Therapy Treatment Patient Details Name: Karen Tucker MRN: [id number] DOB: 1951-07-01 Today's Date: 05/10/2017    History of Present Illness Pt is a 66 year old female s/p L TKA with hx of R TKA    PT Comments    Pt became very anxious and fearful of pain when attempting to apply KI.  Pt requiring increased time and reassurance however able to ambulate in hallway.  Possible d/c home later today.  Follow Up Recommendations  DC plan and follow up therapy as arranged by surgeon;Outpatient PT     Equipment Recommendations  None recommended by PT    Recommendations for Other Services       Precautions / Restrictions Precautions Precautions: Knee;Fall Required Braces or Orthoses: Knee Immobilizer - Left Restrictions Other Position/Activity Restrictions: WBAT    Mobility  Bed Mobility Overal bed mobility: Needs Assistance Bed Mobility: Supine to Sit;Sit to Supine     Supine to sit: Min assist Sit to supine: Min assist   General bed mobility comments: Pt presents with increased anxiety and difficulty just applying KI, increased time required, assist for pain control  Transfers Overall transfer level: Needs assistance Equipment used: Rolling walker (2 wheeled) Transfers: Sit to/from Stand Sit to Stand: Min assist         General transfer comment: pt with increased anxiety and fear of pain, spouse and PT assisted to standing with 2 HHA, verbal cues for UE and LE positioning  Ambulation/Gait Ambulation/Gait assistance: Min guard Ambulation Distance (Feet): 80 Feet Assistive device: Rolling walker (2 wheeled) Gait Pattern/deviations: Step-to pattern;Decreased stance time - left;Antalgic     General Gait Details: verbal cues for sequence, RW positioning, step length   Stairs            Wheelchair Mobility    Modified Rankin (Stroke Patients Only)       Balance                                            Cognition  Arousal/Alertness: Awake/alert Behavior During Therapy: WFL for tasks assessed/performed Overall Cognitive Status: Within Functional Limits for tasks assessed                                        Exercises      General Comments        Pertinent Vitals/Pain Pain Assessment: 0-10 Pain Score: 5  Pain Location: L knee with mobility Pain Descriptors / Indicators: Aching;Sore Pain Intervention(s): Limited activity within patient's tolerance;Monitored during session;Repositioned    Home Living                      Prior Function            PT Goals (current goals can now be found in the care plan section) Progress towards PT goals: Progressing toward goals    Frequency    7X/week      PT Plan Current plan remains appropriate    Co-evaluation              AM-PAC PT "6 Clicks" Daily Activity  Outcome Measure  Difficulty turning over in bed (including adjusting bedclothes, sheets and blankets)?: None Difficulty moving from lying on back to sitting on the side of the bed? : A  Little Difficulty sitting down on and standing up from a chair with arms (e.g., wheelchair, bedside commode, etc,.)?: A Little Help needed moving to and from a bed to chair (including a wheelchair)?: A Little Help needed walking in hospital room?: A Little Help needed climbing 3-5 steps with a railing? : A Little 6 Click Score: 19    End of Session Equipment Utilized During Treatment: Left knee immobilizer Activity Tolerance: Patient tolerated treatment well Patient left: in bed;with call bell/phone within reach;with family/visitor present   PT Visit Diagnosis: Other abnormalities of gait and mobility (R26.89);Pain Pain - Right/Left: Left Pain - part of body: Knee     Time: 7948-0165 PT Time Calculation (min) (ACUTE ONLY): 23 min  Charges:  $Gait Training: 8-22 mins $Therapeutic Activity: 8-22 mins                    G Codes:       Carmelia Bake, PT,  DPT 05/10/2017 Pager: [phone]  York Ram E 05/10/2017, 1:01 PM

## 2017-05-10 NOTE — Progress Notes (Signed)
Occupational Therapy Treatment Patient Details Name: Karen Tucker MRN: [id number] DOB: 1951/02/02 Today's Date: 05/10/2017    History of present illness Pt is a 66 year old female s/p L TKA with hx of R TKA   OT comments  Pt progressing towards goals, completing seated ADLs with setup and shower transfer with MinA. Pt requires encouragement during session as Pt becomes anxious when anticipating L LE pain. Pt will have assistance from spouse after discharge home with ADLs PRN. Education provided and questions answered throughout. Will continue to follow while in acute setting.    Follow Up Recommendations  No OT follow up;Supervision/Assistance - 24 hour    Equipment Recommendations  None recommended by OT          Precautions / Restrictions Precautions Precautions: Knee;Fall Required Braces or Orthoses: Knee Immobilizer - Left Restrictions Weight Bearing Restrictions: No Other Position/Activity Restrictions: WBAT       Mobility Bed Mobility Overal bed mobility: Needs Assistance Bed Mobility: Supine to Sit;Sit to Supine     Supine to sit: Min assist Sit to supine: Min assist   General bed mobility comments: Assist for LE   Transfers Overall transfer level: Needs assistance Equipment used: Rolling walker (2 wheeled) Transfers: Sit to/from Stand Sit to Stand: Min assist         General transfer comment: pt with increased anxiety and fear of pain, spouse and PT assisted to standing with 2 HHA, verbal cues for UE and LE positioning                                               ADL either performed or assessed with clinical judgement   ADL Overall ADL's : Needs assistance/impaired     Grooming: Set up;Sitting;Oral care;Wash/dry face                           Tub/ Shower Transfer: Walk-in shower;Minimal assistance;Cueing for sequencing;Ambulation;Rolling walker Tub/Shower Transfer Details (indicate cue type and reason): verbal  cues for sequencing and assist for steadying Functional mobility during ADLs: Min guard;Rolling walker General ADL Comments: educated on compensatory techniques for completing ADLs                        Cognition Arousal/Alertness: Awake/alert Behavior During Therapy: WFL for tasks assessed/performed Overall Cognitive Status: Within Functional Limits for tasks assessed                                 General Comments: Pt becomes anxious when anticipating LE pain during mobility                     General Comments      Pertinent Vitals/ Pain       Pain Assessment: Faces Pain Score: 5  Faces Pain Scale: Hurts even more Pain Location: L knee with mobility Pain Descriptors / Indicators: Aching;Sore Pain Intervention(s): Limited activity within patient's tolerance;Monitored during session;Ice applied;Repositioned  Frequency  Min 2X/week        Progress Toward Goals  OT Goals(current goals can now be found in the care plan section)  Progress towards OT goals: Progressing toward goals  Acute Rehab OT Goals Patient Stated Goal: return to independence OT Goal Formulation: With patient Time For Goal Achievement: 05/16/17 Potential to Achieve Goals: Good  Plan Discharge plan remains appropriate                     AM-PAC PT "6 Clicks" Daily Activity     Outcome Measure   Help from another person eating meals?: None Help from another person taking care of personal grooming?: A Little Help from another person toileting, which includes using toliet, bedpan, or urinal?: A Little Help from another person bathing (including washing, rinsing, drying)?: A Little Help from another person to put on and taking off regular upper body clothing?: A Little Help from another person to put on and taking off regular lower body clothing?: A Lot 6 Click Score: 18    End  of Session Equipment Utilized During Treatment: Gait belt;Rolling walker  OT Visit Diagnosis: Pain;Muscle weakness (generalized) (M62.81) Pain - Right/Left: Left Pain - part of body: Knee   Activity Tolerance Patient tolerated treatment well   Patient Left in bed;with call bell/phone within reach;with family/visitor present             Time: 1209-1233 OT Time Calculation (min): 24 min  Charges: OT General Charges $OT Visit: 1 Procedure OT Treatments $Self Care/Home Management : 8-22 mins  Karen Tucker, OT Pager [phone] 05/10/2017    Karen Tucker 05/10/2017, 2:05 PM

## 2017-05-12 DIAGNOSIS — M25462 Effusion, left knee: Secondary | ICD-10-CM | POA: Diagnosis not present

## 2017-05-12 DIAGNOSIS — M25562 Pain in left knee: Secondary | ICD-10-CM | POA: Diagnosis not present

## 2017-05-12 DIAGNOSIS — M25662 Stiffness of left knee, not elsewhere classified: Secondary | ICD-10-CM | POA: Diagnosis not present

## 2017-05-12 DIAGNOSIS — R262 Difficulty in walking, not elsewhere classified: Secondary | ICD-10-CM | POA: Diagnosis not present

## 2017-05-15 DIAGNOSIS — M25662 Stiffness of left knee, not elsewhere classified: Secondary | ICD-10-CM | POA: Diagnosis not present

## 2017-05-15 DIAGNOSIS — M25562 Pain in left knee: Secondary | ICD-10-CM | POA: Diagnosis not present

## 2017-05-15 DIAGNOSIS — R262 Difficulty in walking, not elsewhere classified: Secondary | ICD-10-CM | POA: Diagnosis not present

## 2017-05-15 DIAGNOSIS — M25462 Effusion, left knee: Secondary | ICD-10-CM | POA: Diagnosis not present

## 2017-05-17 DIAGNOSIS — M25462 Effusion, left knee: Secondary | ICD-10-CM | POA: Diagnosis not present

## 2017-05-17 DIAGNOSIS — R262 Difficulty in walking, not elsewhere classified: Secondary | ICD-10-CM | POA: Diagnosis not present

## 2017-05-17 DIAGNOSIS — M25662 Stiffness of left knee, not elsewhere classified: Secondary | ICD-10-CM | POA: Diagnosis not present

## 2017-05-17 DIAGNOSIS — M25562 Pain in left knee: Secondary | ICD-10-CM | POA: Diagnosis not present

## 2017-05-19 DIAGNOSIS — R262 Difficulty in walking, not elsewhere classified: Secondary | ICD-10-CM | POA: Diagnosis not present

## 2017-05-19 DIAGNOSIS — M25462 Effusion, left knee: Secondary | ICD-10-CM | POA: Diagnosis not present

## 2017-05-19 DIAGNOSIS — M25562 Pain in left knee: Secondary | ICD-10-CM | POA: Diagnosis not present

## 2017-05-19 DIAGNOSIS — M25662 Stiffness of left knee, not elsewhere classified: Secondary | ICD-10-CM | POA: Diagnosis not present

## 2017-05-23 DIAGNOSIS — M25462 Effusion, left knee: Secondary | ICD-10-CM | POA: Diagnosis not present

## 2017-05-23 DIAGNOSIS — Z96652 Presence of left artificial knee joint: Secondary | ICD-10-CM | POA: Diagnosis not present

## 2017-05-23 DIAGNOSIS — M25662 Stiffness of left knee, not elsewhere classified: Secondary | ICD-10-CM | POA: Diagnosis not present

## 2017-05-23 DIAGNOSIS — M25562 Pain in left knee: Secondary | ICD-10-CM | POA: Diagnosis not present

## 2017-05-23 DIAGNOSIS — R262 Difficulty in walking, not elsewhere classified: Secondary | ICD-10-CM | POA: Diagnosis not present

## 2017-05-23 DIAGNOSIS — Z471 Aftercare following joint replacement surgery: Secondary | ICD-10-CM | POA: Diagnosis not present

## 2017-05-23 DIAGNOSIS — M1712 Unilateral primary osteoarthritis, left knee: Secondary | ICD-10-CM | POA: Diagnosis not present

## 2017-05-24 DIAGNOSIS — M25462 Effusion, left knee: Secondary | ICD-10-CM | POA: Diagnosis not present

## 2017-05-24 DIAGNOSIS — M25662 Stiffness of left knee, not elsewhere classified: Secondary | ICD-10-CM | POA: Diagnosis not present

## 2017-05-24 DIAGNOSIS — M25562 Pain in left knee: Secondary | ICD-10-CM | POA: Diagnosis not present

## 2017-05-24 DIAGNOSIS — R262 Difficulty in walking, not elsewhere classified: Secondary | ICD-10-CM | POA: Diagnosis not present

## 2017-05-25 DIAGNOSIS — M25562 Pain in left knee: Secondary | ICD-10-CM | POA: Diagnosis not present

## 2017-05-25 DIAGNOSIS — M25662 Stiffness of left knee, not elsewhere classified: Secondary | ICD-10-CM | POA: Diagnosis not present

## 2017-05-25 DIAGNOSIS — M25462 Effusion, left knee: Secondary | ICD-10-CM | POA: Diagnosis not present

## 2017-05-25 DIAGNOSIS — R262 Difficulty in walking, not elsewhere classified: Secondary | ICD-10-CM | POA: Diagnosis not present

## 2017-05-29 DIAGNOSIS — M25462 Effusion, left knee: Secondary | ICD-10-CM | POA: Diagnosis not present

## 2017-05-29 DIAGNOSIS — M25662 Stiffness of left knee, not elsewhere classified: Secondary | ICD-10-CM | POA: Diagnosis not present

## 2017-05-29 DIAGNOSIS — R262 Difficulty in walking, not elsewhere classified: Secondary | ICD-10-CM | POA: Diagnosis not present

## 2017-05-29 DIAGNOSIS — M25562 Pain in left knee: Secondary | ICD-10-CM | POA: Diagnosis not present

## 2017-05-30 DIAGNOSIS — R262 Difficulty in walking, not elsewhere classified: Secondary | ICD-10-CM | POA: Diagnosis not present

## 2017-05-30 DIAGNOSIS — M25662 Stiffness of left knee, not elsewhere classified: Secondary | ICD-10-CM | POA: Diagnosis not present

## 2017-05-30 DIAGNOSIS — M25462 Effusion, left knee: Secondary | ICD-10-CM | POA: Diagnosis not present

## 2017-05-30 DIAGNOSIS — M25562 Pain in left knee: Secondary | ICD-10-CM | POA: Diagnosis not present

## 2017-06-01 DIAGNOSIS — M25462 Effusion, left knee: Secondary | ICD-10-CM | POA: Diagnosis not present

## 2017-06-01 DIAGNOSIS — M25662 Stiffness of left knee, not elsewhere classified: Secondary | ICD-10-CM | POA: Diagnosis not present

## 2017-06-01 DIAGNOSIS — M25562 Pain in left knee: Secondary | ICD-10-CM | POA: Diagnosis not present

## 2017-06-01 DIAGNOSIS — R262 Difficulty in walking, not elsewhere classified: Secondary | ICD-10-CM | POA: Diagnosis not present

## 2017-06-06 DIAGNOSIS — R262 Difficulty in walking, not elsewhere classified: Secondary | ICD-10-CM | POA: Diagnosis not present

## 2017-06-06 DIAGNOSIS — M25562 Pain in left knee: Secondary | ICD-10-CM | POA: Diagnosis not present

## 2017-06-06 DIAGNOSIS — M25662 Stiffness of left knee, not elsewhere classified: Secondary | ICD-10-CM | POA: Diagnosis not present

## 2017-06-06 DIAGNOSIS — M25462 Effusion, left knee: Secondary | ICD-10-CM | POA: Diagnosis not present

## 2017-06-07 DIAGNOSIS — M25462 Effusion, left knee: Secondary | ICD-10-CM | POA: Diagnosis not present

## 2017-06-07 DIAGNOSIS — M25562 Pain in left knee: Secondary | ICD-10-CM | POA: Diagnosis not present

## 2017-06-07 DIAGNOSIS — M25662 Stiffness of left knee, not elsewhere classified: Secondary | ICD-10-CM | POA: Diagnosis not present

## 2017-06-07 DIAGNOSIS — R262 Difficulty in walking, not elsewhere classified: Secondary | ICD-10-CM | POA: Diagnosis not present

## 2017-06-08 DIAGNOSIS — E8881 Metabolic syndrome: Secondary | ICD-10-CM | POA: Diagnosis not present

## 2017-06-08 DIAGNOSIS — K7581 Nonalcoholic steatohepatitis (NASH): Secondary | ICD-10-CM | POA: Diagnosis not present

## 2017-06-08 DIAGNOSIS — M25662 Stiffness of left knee, not elsewhere classified: Secondary | ICD-10-CM | POA: Diagnosis not present

## 2017-06-08 DIAGNOSIS — M25462 Effusion, left knee: Secondary | ICD-10-CM | POA: Diagnosis not present

## 2017-06-08 DIAGNOSIS — Z9189 Other specified personal risk factors, not elsewhere classified: Secondary | ICD-10-CM | POA: Diagnosis not present

## 2017-06-08 DIAGNOSIS — R739 Hyperglycemia, unspecified: Secondary | ICD-10-CM | POA: Diagnosis not present

## 2017-06-08 DIAGNOSIS — R262 Difficulty in walking, not elsewhere classified: Secondary | ICD-10-CM | POA: Diagnosis not present

## 2017-06-08 DIAGNOSIS — M25562 Pain in left knee: Secondary | ICD-10-CM | POA: Diagnosis not present

## 2017-06-08 DIAGNOSIS — E876 Hypokalemia: Secondary | ICD-10-CM | POA: Diagnosis not present

## 2017-06-08 DIAGNOSIS — E782 Mixed hyperlipidemia: Secondary | ICD-10-CM | POA: Diagnosis not present

## 2017-06-08 DIAGNOSIS — I10 Essential (primary) hypertension: Secondary | ICD-10-CM | POA: Diagnosis not present

## 2017-06-12 DIAGNOSIS — M25462 Effusion, left knee: Secondary | ICD-10-CM | POA: Diagnosis not present

## 2017-06-12 DIAGNOSIS — E8881 Metabolic syndrome: Secondary | ICD-10-CM | POA: Diagnosis not present

## 2017-06-12 DIAGNOSIS — R7301 Impaired fasting glucose: Secondary | ICD-10-CM | POA: Diagnosis not present

## 2017-06-12 DIAGNOSIS — M25562 Pain in left knee: Secondary | ICD-10-CM | POA: Diagnosis not present

## 2017-06-12 DIAGNOSIS — E876 Hypokalemia: Secondary | ICD-10-CM | POA: Diagnosis not present

## 2017-06-12 DIAGNOSIS — M19032 Primary osteoarthritis, left wrist: Secondary | ICD-10-CM | POA: Diagnosis not present

## 2017-06-12 DIAGNOSIS — K7581 Nonalcoholic steatohepatitis (NASH): Secondary | ICD-10-CM | POA: Diagnosis not present

## 2017-06-12 DIAGNOSIS — R262 Difficulty in walking, not elsewhere classified: Secondary | ICD-10-CM | POA: Diagnosis not present

## 2017-06-12 DIAGNOSIS — Z6831 Body mass index (BMI) 31.0-31.9, adult: Secondary | ICD-10-CM | POA: Diagnosis not present

## 2017-06-12 DIAGNOSIS — I10 Essential (primary) hypertension: Secondary | ICD-10-CM | POA: Diagnosis not present

## 2017-06-12 DIAGNOSIS — M25662 Stiffness of left knee, not elsewhere classified: Secondary | ICD-10-CM | POA: Diagnosis not present

## 2017-06-12 DIAGNOSIS — E782 Mixed hyperlipidemia: Secondary | ICD-10-CM | POA: Diagnosis not present

## 2017-06-13 DIAGNOSIS — Z471 Aftercare following joint replacement surgery: Secondary | ICD-10-CM | POA: Diagnosis not present

## 2017-06-13 DIAGNOSIS — M1712 Unilateral primary osteoarthritis, left knee: Secondary | ICD-10-CM | POA: Diagnosis not present

## 2017-06-13 DIAGNOSIS — Z96652 Presence of left artificial knee joint: Secondary | ICD-10-CM | POA: Diagnosis not present

## 2017-06-14 DIAGNOSIS — M25562 Pain in left knee: Secondary | ICD-10-CM | POA: Diagnosis not present

## 2017-06-14 DIAGNOSIS — M25662 Stiffness of left knee, not elsewhere classified: Secondary | ICD-10-CM | POA: Diagnosis not present

## 2017-06-14 DIAGNOSIS — M25462 Effusion, left knee: Secondary | ICD-10-CM | POA: Diagnosis not present

## 2017-06-14 DIAGNOSIS — R262 Difficulty in walking, not elsewhere classified: Secondary | ICD-10-CM | POA: Diagnosis not present

## 2017-06-16 DIAGNOSIS — M25462 Effusion, left knee: Secondary | ICD-10-CM | POA: Diagnosis not present

## 2017-06-16 DIAGNOSIS — M25662 Stiffness of left knee, not elsewhere classified: Secondary | ICD-10-CM | POA: Diagnosis not present

## 2017-06-16 DIAGNOSIS — M25562 Pain in left knee: Secondary | ICD-10-CM | POA: Diagnosis not present

## 2017-06-16 DIAGNOSIS — R262 Difficulty in walking, not elsewhere classified: Secondary | ICD-10-CM | POA: Diagnosis not present

## 2017-06-19 DIAGNOSIS — M25462 Effusion, left knee: Secondary | ICD-10-CM | POA: Diagnosis not present

## 2017-06-19 DIAGNOSIS — R262 Difficulty in walking, not elsewhere classified: Secondary | ICD-10-CM | POA: Diagnosis not present

## 2017-06-19 DIAGNOSIS — M25562 Pain in left knee: Secondary | ICD-10-CM | POA: Diagnosis not present

## 2017-06-19 DIAGNOSIS — M25662 Stiffness of left knee, not elsewhere classified: Secondary | ICD-10-CM | POA: Diagnosis not present

## 2017-06-20 DIAGNOSIS — M25562 Pain in left knee: Secondary | ICD-10-CM | POA: Diagnosis not present

## 2017-06-20 DIAGNOSIS — M25662 Stiffness of left knee, not elsewhere classified: Secondary | ICD-10-CM | POA: Diagnosis not present

## 2017-06-20 DIAGNOSIS — R262 Difficulty in walking, not elsewhere classified: Secondary | ICD-10-CM | POA: Diagnosis not present

## 2017-06-20 DIAGNOSIS — M25462 Effusion, left knee: Secondary | ICD-10-CM | POA: Diagnosis not present

## 2017-06-21 DIAGNOSIS — M25662 Stiffness of left knee, not elsewhere classified: Secondary | ICD-10-CM | POA: Diagnosis not present

## 2017-06-21 DIAGNOSIS — M25562 Pain in left knee: Secondary | ICD-10-CM | POA: Diagnosis not present

## 2017-06-21 DIAGNOSIS — M25462 Effusion, left knee: Secondary | ICD-10-CM | POA: Diagnosis not present

## 2017-06-21 DIAGNOSIS — R262 Difficulty in walking, not elsewhere classified: Secondary | ICD-10-CM | POA: Diagnosis not present

## 2017-07-18 DIAGNOSIS — M1712 Unilateral primary osteoarthritis, left knee: Secondary | ICD-10-CM | POA: Diagnosis not present

## 2017-10-03 DIAGNOSIS — E782 Mixed hyperlipidemia: Secondary | ICD-10-CM | POA: Diagnosis not present

## 2017-10-03 DIAGNOSIS — Z9189 Other specified personal risk factors, not elsewhere classified: Secondary | ICD-10-CM | POA: Diagnosis not present

## 2017-10-03 DIAGNOSIS — R739 Hyperglycemia, unspecified: Secondary | ICD-10-CM | POA: Diagnosis not present

## 2017-10-03 DIAGNOSIS — I10 Essential (primary) hypertension: Secondary | ICD-10-CM | POA: Diagnosis not present

## 2017-10-03 DIAGNOSIS — E8881 Metabolic syndrome: Secondary | ICD-10-CM | POA: Diagnosis not present

## 2017-10-03 DIAGNOSIS — E876 Hypokalemia: Secondary | ICD-10-CM | POA: Diagnosis not present

## 2017-10-03 DIAGNOSIS — M19032 Primary osteoarthritis, left wrist: Secondary | ICD-10-CM | POA: Diagnosis not present

## 2017-10-05 DIAGNOSIS — Z6831 Body mass index (BMI) 31.0-31.9, adult: Secondary | ICD-10-CM | POA: Diagnosis not present

## 2017-10-05 DIAGNOSIS — E8881 Metabolic syndrome: Secondary | ICD-10-CM | POA: Diagnosis not present

## 2017-10-05 DIAGNOSIS — I10 Essential (primary) hypertension: Secondary | ICD-10-CM | POA: Diagnosis not present

## 2017-10-05 DIAGNOSIS — K7581 Nonalcoholic steatohepatitis (NASH): Secondary | ICD-10-CM | POA: Diagnosis not present

## 2017-10-05 DIAGNOSIS — Z1212 Encounter for screening for malignant neoplasm of rectum: Secondary | ICD-10-CM | POA: Diagnosis not present

## 2017-10-05 DIAGNOSIS — E876 Hypokalemia: Secondary | ICD-10-CM | POA: Diagnosis not present

## 2017-10-05 DIAGNOSIS — E782 Mixed hyperlipidemia: Secondary | ICD-10-CM | POA: Diagnosis not present

## 2017-10-05 DIAGNOSIS — R7301 Impaired fasting glucose: Secondary | ICD-10-CM | POA: Diagnosis not present

## 2017-10-10 DIAGNOSIS — Z23 Encounter for immunization: Secondary | ICD-10-CM | POA: Diagnosis not present

## 2017-10-17 DIAGNOSIS — B078 Other viral warts: Secondary | ICD-10-CM | POA: Diagnosis not present

## 2017-10-17 DIAGNOSIS — C44529 Squamous cell carcinoma of skin of other part of trunk: Secondary | ICD-10-CM | POA: Diagnosis not present

## 2017-10-17 DIAGNOSIS — L989 Disorder of the skin and subcutaneous tissue, unspecified: Secondary | ICD-10-CM | POA: Diagnosis not present

## 2017-10-18 ENCOUNTER — Other Ambulatory Visit: Payer: Self-pay | Admitting: Family Medicine

## 2017-10-18 DIAGNOSIS — Z1231 Encounter for screening mammogram for malignant neoplasm of breast: Secondary | ICD-10-CM

## 2017-10-24 ENCOUNTER — Ambulatory Visit
Admission: RE | Admit: 2017-10-24 | Discharge: 2017-10-24 | Disposition: A | Discharge status: Home / Self Care | Payer: Medicare Other | Source: Ambulatory Visit | Attending: Family Medicine | Admitting: Family Medicine

## 2017-10-24 DIAGNOSIS — Z1231 Encounter for screening mammogram for malignant neoplasm of breast: Secondary | ICD-10-CM | POA: Diagnosis not present

## 2018-02-02 DIAGNOSIS — I10 Essential (primary) hypertension: Secondary | ICD-10-CM | POA: Diagnosis not present

## 2018-02-02 DIAGNOSIS — E876 Hypokalemia: Secondary | ICD-10-CM | POA: Diagnosis not present

## 2018-02-02 DIAGNOSIS — R739 Hyperglycemia, unspecified: Secondary | ICD-10-CM | POA: Diagnosis not present

## 2018-02-02 DIAGNOSIS — E782 Mixed hyperlipidemia: Secondary | ICD-10-CM | POA: Diagnosis not present

## 2018-02-02 DIAGNOSIS — K7581 Nonalcoholic steatohepatitis (NASH): Secondary | ICD-10-CM | POA: Diagnosis not present

## 2018-02-02 DIAGNOSIS — E8881 Metabolic syndrome: Secondary | ICD-10-CM | POA: Diagnosis not present

## 2018-02-02 DIAGNOSIS — Z9189 Other specified personal risk factors, not elsewhere classified: Secondary | ICD-10-CM | POA: Diagnosis not present

## 2018-02-06 DIAGNOSIS — I10 Essential (primary) hypertension: Secondary | ICD-10-CM | POA: Diagnosis not present

## 2018-02-06 DIAGNOSIS — Z0001 Encounter for general adult medical examination with abnormal findings: Secondary | ICD-10-CM | POA: Diagnosis not present

## 2018-02-06 DIAGNOSIS — E876 Hypokalemia: Secondary | ICD-10-CM | POA: Diagnosis not present

## 2018-02-06 DIAGNOSIS — Z6832 Body mass index (BMI) 32.0-32.9, adult: Secondary | ICD-10-CM | POA: Diagnosis not present

## 2018-02-06 DIAGNOSIS — R7301 Impaired fasting glucose: Secondary | ICD-10-CM | POA: Diagnosis not present

## 2018-02-06 DIAGNOSIS — K7581 Nonalcoholic steatohepatitis (NASH): Secondary | ICD-10-CM | POA: Diagnosis not present

## 2018-02-06 DIAGNOSIS — E782 Mixed hyperlipidemia: Secondary | ICD-10-CM | POA: Diagnosis not present

## 2018-02-06 DIAGNOSIS — E8881 Metabolic syndrome: Secondary | ICD-10-CM | POA: Diagnosis not present

## 2018-02-06 DIAGNOSIS — E6609 Other obesity due to excess calories: Secondary | ICD-10-CM | POA: Diagnosis not present

## 2018-02-06 DIAGNOSIS — M19032 Primary osteoarthritis, left wrist: Secondary | ICD-10-CM | POA: Diagnosis not present

## 2018-07-05 DIAGNOSIS — Z1389 Encounter for screening for other disorder: Secondary | ICD-10-CM | POA: Diagnosis not present

## 2018-07-05 DIAGNOSIS — Z96652 Presence of left artificial knee joint: Secondary | ICD-10-CM | POA: Diagnosis not present

## 2018-07-05 DIAGNOSIS — Z6834 Body mass index (BMI) 34.0-34.9, adult: Secondary | ICD-10-CM | POA: Diagnosis not present

## 2018-07-05 DIAGNOSIS — E876 Hypokalemia: Secondary | ICD-10-CM | POA: Diagnosis not present

## 2018-07-05 DIAGNOSIS — Z1331 Encounter for screening for depression: Secondary | ICD-10-CM | POA: Diagnosis not present

## 2018-07-05 DIAGNOSIS — Z23 Encounter for immunization: Secondary | ICD-10-CM | POA: Diagnosis not present

## 2018-07-05 DIAGNOSIS — R7301 Impaired fasting glucose: Secondary | ICD-10-CM | POA: Diagnosis not present

## 2018-07-05 DIAGNOSIS — M1712 Unilateral primary osteoarthritis, left knee: Secondary | ICD-10-CM | POA: Diagnosis not present

## 2018-07-05 DIAGNOSIS — E6609 Other obesity due to excess calories: Secondary | ICD-10-CM | POA: Diagnosis not present

## 2018-07-05 DIAGNOSIS — E782 Mixed hyperlipidemia: Secondary | ICD-10-CM | POA: Diagnosis not present

## 2018-07-05 DIAGNOSIS — I10 Essential (primary) hypertension: Secondary | ICD-10-CM | POA: Diagnosis not present

## 2018-07-05 DIAGNOSIS — Z1212 Encounter for screening for malignant neoplasm of rectum: Secondary | ICD-10-CM | POA: Diagnosis not present

## 2018-07-05 DIAGNOSIS — Z471 Aftercare following joint replacement surgery: Secondary | ICD-10-CM | POA: Diagnosis not present

## 2018-07-05 DIAGNOSIS — K7581 Nonalcoholic steatohepatitis (NASH): Secondary | ICD-10-CM | POA: Diagnosis not present

## 2018-07-05 DIAGNOSIS — M19032 Primary osteoarthritis, left wrist: Secondary | ICD-10-CM | POA: Diagnosis not present

## 2018-07-05 DIAGNOSIS — M545 Low back pain: Secondary | ICD-10-CM | POA: Diagnosis not present

## 2018-07-05 DIAGNOSIS — Z6832 Body mass index (BMI) 32.0-32.9, adult: Secondary | ICD-10-CM | POA: Diagnosis not present

## 2018-07-05 DIAGNOSIS — E8881 Metabolic syndrome: Secondary | ICD-10-CM | POA: Diagnosis not present

## 2018-07-10 DIAGNOSIS — M545 Low back pain: Secondary | ICD-10-CM | POA: Diagnosis not present

## 2018-07-10 DIAGNOSIS — M79605 Pain in left leg: Secondary | ICD-10-CM | POA: Diagnosis not present

## 2018-07-10 DIAGNOSIS — M5416 Radiculopathy, lumbar region: Secondary | ICD-10-CM | POA: Diagnosis not present

## 2018-07-10 DIAGNOSIS — M6281 Muscle weakness (generalized): Secondary | ICD-10-CM | POA: Diagnosis not present

## 2018-07-11 DIAGNOSIS — M545 Low back pain: Secondary | ICD-10-CM | POA: Diagnosis not present

## 2018-07-11 DIAGNOSIS — M5416 Radiculopathy, lumbar region: Secondary | ICD-10-CM | POA: Diagnosis not present

## 2018-07-11 DIAGNOSIS — M6281 Muscle weakness (generalized): Secondary | ICD-10-CM | POA: Diagnosis not present

## 2018-07-11 DIAGNOSIS — M79605 Pain in left leg: Secondary | ICD-10-CM | POA: Diagnosis not present

## 2018-07-13 DIAGNOSIS — M5416 Radiculopathy, lumbar region: Secondary | ICD-10-CM | POA: Diagnosis not present

## 2018-07-13 DIAGNOSIS — M545 Low back pain: Secondary | ICD-10-CM | POA: Diagnosis not present

## 2018-07-13 DIAGNOSIS — M6281 Muscle weakness (generalized): Secondary | ICD-10-CM | POA: Diagnosis not present

## 2018-07-13 DIAGNOSIS — M79605 Pain in left leg: Secondary | ICD-10-CM | POA: Diagnosis not present

## 2018-07-16 DIAGNOSIS — M5416 Radiculopathy, lumbar region: Secondary | ICD-10-CM | POA: Diagnosis not present

## 2018-07-16 DIAGNOSIS — M545 Low back pain: Secondary | ICD-10-CM | POA: Diagnosis not present

## 2018-07-16 DIAGNOSIS — M6281 Muscle weakness (generalized): Secondary | ICD-10-CM | POA: Diagnosis not present

## 2018-07-16 DIAGNOSIS — M79605 Pain in left leg: Secondary | ICD-10-CM | POA: Diagnosis not present

## 2018-07-17 DIAGNOSIS — M79605 Pain in left leg: Secondary | ICD-10-CM | POA: Diagnosis not present

## 2018-07-17 DIAGNOSIS — M6281 Muscle weakness (generalized): Secondary | ICD-10-CM | POA: Diagnosis not present

## 2018-07-17 DIAGNOSIS — M5416 Radiculopathy, lumbar region: Secondary | ICD-10-CM | POA: Diagnosis not present

## 2018-07-17 DIAGNOSIS — M545 Low back pain: Secondary | ICD-10-CM | POA: Diagnosis not present

## 2018-07-19 DIAGNOSIS — M5416 Radiculopathy, lumbar region: Secondary | ICD-10-CM | POA: Diagnosis not present

## 2018-07-19 DIAGNOSIS — M79605 Pain in left leg: Secondary | ICD-10-CM | POA: Diagnosis not present

## 2018-07-19 DIAGNOSIS — M6281 Muscle weakness (generalized): Secondary | ICD-10-CM | POA: Diagnosis not present

## 2018-07-19 DIAGNOSIS — M545 Low back pain: Secondary | ICD-10-CM | POA: Diagnosis not present

## 2018-07-23 DIAGNOSIS — M545 Low back pain: Secondary | ICD-10-CM | POA: Diagnosis not present

## 2018-07-23 DIAGNOSIS — M79605 Pain in left leg: Secondary | ICD-10-CM | POA: Diagnosis not present

## 2018-07-23 DIAGNOSIS — M6281 Muscle weakness (generalized): Secondary | ICD-10-CM | POA: Diagnosis not present

## 2018-07-23 DIAGNOSIS — M5416 Radiculopathy, lumbar region: Secondary | ICD-10-CM | POA: Diagnosis not present

## 2018-07-24 DIAGNOSIS — M6281 Muscle weakness (generalized): Secondary | ICD-10-CM | POA: Diagnosis not present

## 2018-07-24 DIAGNOSIS — M79605 Pain in left leg: Secondary | ICD-10-CM | POA: Diagnosis not present

## 2018-07-24 DIAGNOSIS — M545 Low back pain: Secondary | ICD-10-CM | POA: Diagnosis not present

## 2018-07-24 DIAGNOSIS — M5416 Radiculopathy, lumbar region: Secondary | ICD-10-CM | POA: Diagnosis not present

## 2018-07-26 DIAGNOSIS — M545 Low back pain: Secondary | ICD-10-CM | POA: Diagnosis not present

## 2018-07-26 DIAGNOSIS — M6281 Muscle weakness (generalized): Secondary | ICD-10-CM | POA: Diagnosis not present

## 2018-07-26 DIAGNOSIS — M5416 Radiculopathy, lumbar region: Secondary | ICD-10-CM | POA: Diagnosis not present

## 2018-07-26 DIAGNOSIS — M79605 Pain in left leg: Secondary | ICD-10-CM | POA: Diagnosis not present

## 2018-08-03 DIAGNOSIS — M5416 Radiculopathy, lumbar region: Secondary | ICD-10-CM | POA: Diagnosis not present

## 2018-08-03 DIAGNOSIS — M79605 Pain in left leg: Secondary | ICD-10-CM | POA: Diagnosis not present

## 2018-08-03 DIAGNOSIS — M6281 Muscle weakness (generalized): Secondary | ICD-10-CM | POA: Diagnosis not present

## 2018-08-03 DIAGNOSIS — M545 Low back pain: Secondary | ICD-10-CM | POA: Diagnosis not present

## 2018-08-10 DIAGNOSIS — M5416 Radiculopathy, lumbar region: Secondary | ICD-10-CM | POA: Diagnosis not present

## 2018-08-10 DIAGNOSIS — M79605 Pain in left leg: Secondary | ICD-10-CM | POA: Diagnosis not present

## 2018-08-10 DIAGNOSIS — M545 Low back pain: Secondary | ICD-10-CM | POA: Diagnosis not present

## 2018-08-10 DIAGNOSIS — M6281 Muscle weakness (generalized): Secondary | ICD-10-CM | POA: Diagnosis not present

## 2018-08-14 DIAGNOSIS — M545 Low back pain: Secondary | ICD-10-CM | POA: Diagnosis not present

## 2018-08-14 DIAGNOSIS — M6281 Muscle weakness (generalized): Secondary | ICD-10-CM | POA: Diagnosis not present

## 2018-08-14 DIAGNOSIS — M5416 Radiculopathy, lumbar region: Secondary | ICD-10-CM | POA: Diagnosis not present

## 2018-08-14 DIAGNOSIS — M79605 Pain in left leg: Secondary | ICD-10-CM | POA: Diagnosis not present

## 2018-08-16 DIAGNOSIS — M6281 Muscle weakness (generalized): Secondary | ICD-10-CM | POA: Diagnosis not present

## 2018-08-16 DIAGNOSIS — M5416 Radiculopathy, lumbar region: Secondary | ICD-10-CM | POA: Diagnosis not present

## 2018-08-16 DIAGNOSIS — M545 Low back pain: Secondary | ICD-10-CM | POA: Diagnosis not present

## 2018-08-16 DIAGNOSIS — M79605 Pain in left leg: Secondary | ICD-10-CM | POA: Diagnosis not present

## 2018-08-17 DIAGNOSIS — Z6833 Body mass index (BMI) 33.0-33.9, adult: Secondary | ICD-10-CM | POA: Diagnosis not present

## 2018-08-17 DIAGNOSIS — K589 Irritable bowel syndrome without diarrhea: Secondary | ICD-10-CM | POA: Diagnosis not present

## 2018-08-17 DIAGNOSIS — M545 Low back pain: Secondary | ICD-10-CM | POA: Diagnosis not present

## 2018-08-22 DIAGNOSIS — M79605 Pain in left leg: Secondary | ICD-10-CM | POA: Diagnosis not present

## 2018-08-22 DIAGNOSIS — M545 Low back pain: Secondary | ICD-10-CM | POA: Diagnosis not present

## 2018-08-22 DIAGNOSIS — M5416 Radiculopathy, lumbar region: Secondary | ICD-10-CM | POA: Diagnosis not present

## 2018-08-22 DIAGNOSIS — M6281 Muscle weakness (generalized): Secondary | ICD-10-CM | POA: Diagnosis not present

## 2018-08-23 DIAGNOSIS — M5416 Radiculopathy, lumbar region: Secondary | ICD-10-CM | POA: Diagnosis not present

## 2018-08-23 DIAGNOSIS — M545 Low back pain: Secondary | ICD-10-CM | POA: Diagnosis not present

## 2018-08-23 DIAGNOSIS — M6281 Muscle weakness (generalized): Secondary | ICD-10-CM | POA: Diagnosis not present

## 2018-08-23 DIAGNOSIS — M79605 Pain in left leg: Secondary | ICD-10-CM | POA: Diagnosis not present

## 2018-08-30 DIAGNOSIS — M545 Low back pain: Secondary | ICD-10-CM | POA: Diagnosis not present

## 2018-08-30 DIAGNOSIS — M6281 Muscle weakness (generalized): Secondary | ICD-10-CM | POA: Diagnosis not present

## 2018-08-30 DIAGNOSIS — M5416 Radiculopathy, lumbar region: Secondary | ICD-10-CM | POA: Diagnosis not present

## 2018-08-30 DIAGNOSIS — M79605 Pain in left leg: Secondary | ICD-10-CM | POA: Diagnosis not present

## 2018-08-31 DIAGNOSIS — M5416 Radiculopathy, lumbar region: Secondary | ICD-10-CM | POA: Diagnosis not present

## 2018-08-31 DIAGNOSIS — M79605 Pain in left leg: Secondary | ICD-10-CM | POA: Diagnosis not present

## 2018-08-31 DIAGNOSIS — M6281 Muscle weakness (generalized): Secondary | ICD-10-CM | POA: Diagnosis not present

## 2018-08-31 DIAGNOSIS — M545 Low back pain: Secondary | ICD-10-CM | POA: Diagnosis not present

## 2018-09-07 DIAGNOSIS — M5416 Radiculopathy, lumbar region: Secondary | ICD-10-CM | POA: Diagnosis not present

## 2018-09-07 DIAGNOSIS — M545 Low back pain: Secondary | ICD-10-CM | POA: Diagnosis not present

## 2018-09-07 DIAGNOSIS — M6281 Muscle weakness (generalized): Secondary | ICD-10-CM | POA: Diagnosis not present

## 2018-09-07 DIAGNOSIS — M79605 Pain in left leg: Secondary | ICD-10-CM | POA: Diagnosis not present

## 2018-09-13 DIAGNOSIS — M79605 Pain in left leg: Secondary | ICD-10-CM | POA: Diagnosis not present

## 2018-09-13 DIAGNOSIS — M545 Low back pain: Secondary | ICD-10-CM | POA: Diagnosis not present

## 2018-09-13 DIAGNOSIS — M6281 Muscle weakness (generalized): Secondary | ICD-10-CM | POA: Diagnosis not present

## 2018-09-13 DIAGNOSIS — M5416 Radiculopathy, lumbar region: Secondary | ICD-10-CM | POA: Diagnosis not present

## 2018-09-14 DIAGNOSIS — M6281 Muscle weakness (generalized): Secondary | ICD-10-CM | POA: Diagnosis not present

## 2018-09-14 DIAGNOSIS — M5416 Radiculopathy, lumbar region: Secondary | ICD-10-CM | POA: Diagnosis not present

## 2018-09-14 DIAGNOSIS — M79605 Pain in left leg: Secondary | ICD-10-CM | POA: Diagnosis not present

## 2018-09-14 DIAGNOSIS — M545 Low back pain: Secondary | ICD-10-CM | POA: Diagnosis not present

## 2018-09-27 ENCOUNTER — Other Ambulatory Visit: Payer: Self-pay | Admitting: Family Medicine

## 2018-09-27 DIAGNOSIS — Z1231 Encounter for screening mammogram for malignant neoplasm of breast: Secondary | ICD-10-CM

## 2018-10-03 DIAGNOSIS — M47816 Spondylosis without myelopathy or radiculopathy, lumbar region: Secondary | ICD-10-CM | POA: Diagnosis not present

## 2018-10-03 DIAGNOSIS — S338XXA Sprain of other parts of lumbar spine and pelvis, initial encounter: Secondary | ICD-10-CM | POA: Diagnosis not present

## 2018-10-03 DIAGNOSIS — H6123 Impacted cerumen, bilateral: Secondary | ICD-10-CM | POA: Diagnosis not present

## 2018-10-03 DIAGNOSIS — M9903 Segmental and somatic dysfunction of lumbar region: Secondary | ICD-10-CM | POA: Diagnosis not present

## 2018-10-05 DIAGNOSIS — M9903 Segmental and somatic dysfunction of lumbar region: Secondary | ICD-10-CM | POA: Diagnosis not present

## 2018-10-05 DIAGNOSIS — S338XXA Sprain of other parts of lumbar spine and pelvis, initial encounter: Secondary | ICD-10-CM | POA: Diagnosis not present

## 2018-10-05 DIAGNOSIS — M47816 Spondylosis without myelopathy or radiculopathy, lumbar region: Secondary | ICD-10-CM | POA: Diagnosis not present

## 2018-10-09 DIAGNOSIS — S338XXA Sprain of other parts of lumbar spine and pelvis, initial encounter: Secondary | ICD-10-CM | POA: Diagnosis not present

## 2018-10-09 DIAGNOSIS — M9903 Segmental and somatic dysfunction of lumbar region: Secondary | ICD-10-CM | POA: Diagnosis not present

## 2018-10-09 DIAGNOSIS — M47816 Spondylosis without myelopathy or radiculopathy, lumbar region: Secondary | ICD-10-CM | POA: Diagnosis not present

## 2018-10-11 DIAGNOSIS — M9903 Segmental and somatic dysfunction of lumbar region: Secondary | ICD-10-CM | POA: Diagnosis not present

## 2018-10-11 DIAGNOSIS — M47816 Spondylosis without myelopathy or radiculopathy, lumbar region: Secondary | ICD-10-CM | POA: Diagnosis not present

## 2018-10-11 DIAGNOSIS — S338XXA Sprain of other parts of lumbar spine and pelvis, initial encounter: Secondary | ICD-10-CM | POA: Diagnosis not present

## 2018-10-11 DIAGNOSIS — H353131 Nonexudative age-related macular degeneration, bilateral, early dry stage: Secondary | ICD-10-CM | POA: Diagnosis not present

## 2018-10-17 DIAGNOSIS — S338XXA Sprain of other parts of lumbar spine and pelvis, initial encounter: Secondary | ICD-10-CM | POA: Diagnosis not present

## 2018-10-17 DIAGNOSIS — M47816 Spondylosis without myelopathy or radiculopathy, lumbar region: Secondary | ICD-10-CM | POA: Diagnosis not present

## 2018-10-17 DIAGNOSIS — M9903 Segmental and somatic dysfunction of lumbar region: Secondary | ICD-10-CM | POA: Diagnosis not present

## 2018-10-18 DIAGNOSIS — M47816 Spondylosis without myelopathy or radiculopathy, lumbar region: Secondary | ICD-10-CM | POA: Diagnosis not present

## 2018-10-18 DIAGNOSIS — M9903 Segmental and somatic dysfunction of lumbar region: Secondary | ICD-10-CM | POA: Diagnosis not present

## 2018-10-18 DIAGNOSIS — S338XXA Sprain of other parts of lumbar spine and pelvis, initial encounter: Secondary | ICD-10-CM | POA: Diagnosis not present

## 2018-10-19 DIAGNOSIS — M9903 Segmental and somatic dysfunction of lumbar region: Secondary | ICD-10-CM | POA: Diagnosis not present

## 2018-10-19 DIAGNOSIS — M47816 Spondylosis without myelopathy or radiculopathy, lumbar region: Secondary | ICD-10-CM | POA: Diagnosis not present

## 2018-10-19 DIAGNOSIS — S338XXA Sprain of other parts of lumbar spine and pelvis, initial encounter: Secondary | ICD-10-CM | POA: Diagnosis not present

## 2018-10-22 DIAGNOSIS — M79671 Pain in right foot: Secondary | ICD-10-CM | POA: Diagnosis not present

## 2018-10-22 DIAGNOSIS — M25579 Pain in unspecified ankle and joints of unspecified foot: Secondary | ICD-10-CM | POA: Diagnosis not present

## 2018-10-22 DIAGNOSIS — M47816 Spondylosis without myelopathy or radiculopathy, lumbar region: Secondary | ICD-10-CM | POA: Diagnosis not present

## 2018-10-22 DIAGNOSIS — M79672 Pain in left foot: Secondary | ICD-10-CM | POA: Diagnosis not present

## 2018-10-22 DIAGNOSIS — M9903 Segmental and somatic dysfunction of lumbar region: Secondary | ICD-10-CM | POA: Diagnosis not present

## 2018-10-22 DIAGNOSIS — S338XXA Sprain of other parts of lumbar spine and pelvis, initial encounter: Secondary | ICD-10-CM | POA: Diagnosis not present

## 2018-10-25 DIAGNOSIS — M47816 Spondylosis without myelopathy or radiculopathy, lumbar region: Secondary | ICD-10-CM | POA: Diagnosis not present

## 2018-10-25 DIAGNOSIS — S338XXA Sprain of other parts of lumbar spine and pelvis, initial encounter: Secondary | ICD-10-CM | POA: Diagnosis not present

## 2018-10-25 DIAGNOSIS — M9903 Segmental and somatic dysfunction of lumbar region: Secondary | ICD-10-CM | POA: Diagnosis not present

## 2018-10-30 ENCOUNTER — Ambulatory Visit: Payer: Medicare Other

## 2018-11-14 DIAGNOSIS — Z23 Encounter for immunization: Secondary | ICD-10-CM | POA: Diagnosis not present

## 2018-11-19 DIAGNOSIS — M79671 Pain in right foot: Secondary | ICD-10-CM | POA: Diagnosis not present

## 2018-11-19 DIAGNOSIS — M25579 Pain in unspecified ankle and joints of unspecified foot: Secondary | ICD-10-CM | POA: Diagnosis not present

## 2018-11-19 DIAGNOSIS — M79672 Pain in left foot: Secondary | ICD-10-CM | POA: Diagnosis not present

## 2018-12-25 ENCOUNTER — Ambulatory Visit
Admission: RE | Admit: 2018-12-25 | Discharge: 2018-12-25 | Disposition: A | Discharge status: Home / Self Care | Payer: Medicare Other | Source: Ambulatory Visit | Attending: Family Medicine | Admitting: Family Medicine

## 2018-12-25 DIAGNOSIS — Z1231 Encounter for screening mammogram for malignant neoplasm of breast: Secondary | ICD-10-CM | POA: Diagnosis not present

## 2018-12-28 DIAGNOSIS — M47816 Spondylosis without myelopathy or radiculopathy, lumbar region: Secondary | ICD-10-CM | POA: Diagnosis not present

## 2018-12-28 DIAGNOSIS — M9903 Segmental and somatic dysfunction of lumbar region: Secondary | ICD-10-CM | POA: Diagnosis not present

## 2018-12-28 DIAGNOSIS — S338XXA Sprain of other parts of lumbar spine and pelvis, initial encounter: Secondary | ICD-10-CM | POA: Diagnosis not present

## 2019-01-04 DIAGNOSIS — S338XXA Sprain of other parts of lumbar spine and pelvis, initial encounter: Secondary | ICD-10-CM | POA: Diagnosis not present

## 2019-01-04 DIAGNOSIS — M47816 Spondylosis without myelopathy or radiculopathy, lumbar region: Secondary | ICD-10-CM | POA: Diagnosis not present

## 2019-01-04 DIAGNOSIS — M9903 Segmental and somatic dysfunction of lumbar region: Secondary | ICD-10-CM | POA: Diagnosis not present

## 2019-02-15 DIAGNOSIS — R5383 Other fatigue: Secondary | ICD-10-CM | POA: Diagnosis not present

## 2019-02-15 DIAGNOSIS — E876 Hypokalemia: Secondary | ICD-10-CM | POA: Diagnosis not present

## 2019-02-15 DIAGNOSIS — I10 Essential (primary) hypertension: Secondary | ICD-10-CM | POA: Diagnosis not present

## 2019-02-15 DIAGNOSIS — E8881 Metabolic syndrome: Secondary | ICD-10-CM | POA: Diagnosis not present

## 2019-02-15 DIAGNOSIS — R739 Hyperglycemia, unspecified: Secondary | ICD-10-CM | POA: Diagnosis not present

## 2019-02-15 DIAGNOSIS — M1000 Idiopathic gout, unspecified site: Secondary | ICD-10-CM | POA: Diagnosis not present

## 2019-02-15 DIAGNOSIS — E782 Mixed hyperlipidemia: Secondary | ICD-10-CM | POA: Diagnosis not present

## 2019-02-15 DIAGNOSIS — R7301 Impaired fasting glucose: Secondary | ICD-10-CM | POA: Diagnosis not present

## 2019-02-20 DIAGNOSIS — I10 Essential (primary) hypertension: Secondary | ICD-10-CM | POA: Diagnosis not present

## 2019-02-20 DIAGNOSIS — Z6834 Body mass index (BMI) 34.0-34.9, adult: Secondary | ICD-10-CM | POA: Diagnosis not present

## 2019-02-20 DIAGNOSIS — Z0001 Encounter for general adult medical examination with abnormal findings: Secondary | ICD-10-CM | POA: Diagnosis not present

## 2019-05-31 DIAGNOSIS — H8112 Benign paroxysmal vertigo, left ear: Secondary | ICD-10-CM | POA: Diagnosis not present

## 2019-05-31 DIAGNOSIS — Z6834 Body mass index (BMI) 34.0-34.9, adult: Secondary | ICD-10-CM | POA: Diagnosis not present

## 2019-06-25 NOTE — Progress Notes (Signed)
Office Visit Note  Patient: Karen Tucker             Date of Birth: [birth year]           MRN: [id number]             PCP: Caryl Bis, MD Referring: Caryl Bis, MD Visit Date: 07/05/2019 Occupation: retired, Orthoptist at [employer]  Subjective:  Pain in multiple joints.   History of Present Illness: Karen Tucker is a 68 y.o. female seen in consultation per request of her PCP.  According to patient about 7 years ago she developed an episode of left first MTP swelling at the time she was diagnosed with gout and was placed on allopurinol.  She states she has not had any gout flares since then.  She has had knee joint arthritis for many years.  She states she had right total knee replacement 9 years ago and left total knee replacement 3 years ago which is been doing well.  For the last 2 years she has been experiencing increased joint pain and discomfort.  She states she has persistent pain and swelling in her left wrist joint.  She has pain in her both hands, right shoulder, both ankles and feet.  She is seen by Dr. Berline Lopes a podiatrist in the past who told her that she has arthritis in her feet and ankles.  She notices some swelling in her ankles.  Activities of Daily Living:  Patient reports morning stiffness for 2 hours.   Patient Reports nocturnal pain.  Difficulty dressing/grooming: Reports Difficulty climbing stairs: Reports Difficulty getting out of chair: Denies Difficulty using hands for taps, buttons, cutlery, and/or writing: Reports  Review of Systems  Constitutional: Positive for fatigue. Negative for night sweats, weight gain and weight loss.  HENT: Negative for mouth sores, trouble swallowing, trouble swallowing, mouth dryness and nose dryness.   Eyes: Negative for pain, redness, itching, visual disturbance and dryness.  Respiratory: Negative for cough, shortness of breath, wheezing and difficulty breathing.   Cardiovascular: Negative  for chest pain, palpitations, hypertension, irregular heartbeat and swelling in legs/feet.  Gastrointestinal: Positive for diarrhea. Negative for abdominal pain, blood in stool and constipation.  Endocrine: Negative for increased urination.  Genitourinary: Negative for painful urination, pelvic pain and vaginal dryness.  Musculoskeletal: Positive for arthralgias, joint pain, joint swelling and morning stiffness. Negative for myalgias, muscle weakness, muscle tenderness and myalgias.  Skin: Negative for color change, rash, hair loss, redness, skin tightness, ulcers and sensitivity to sunlight.  Allergic/Immunologic: Negative for susceptible to infections.  Neurological: Positive for dizziness and weakness. Negative for light-headedness, headaches, memory loss and night sweats.  Hematological: Negative for bruising/bleeding tendency and swollen glands.  Psychiatric/Behavioral: Negative for depressed mood, confusion and sleep disturbance. The patient is not nervous/anxious.     PMFS History:  Patient Active Problem List   Diagnosis Date Noted  . OA (osteoarthritis) of knee 05/08/2017    Past Medical History:  Diagnosis Date  . Gout   . Hypertension   . PONV (postoperative nausea and vomiting)     Family History  Problem Relation Age of Onset  . Bone cancer Mother   . Heart disease Father   . Cancer Brother   . Cancer Sister   . Healthy Son   . Healthy Son   . Breast cancer Neg Hx    Past Surgical History:  Procedure Laterality Date  . APPENDECTOMY    . CHOLECYSTECTOMY    .  EXTRACORPOREAL SHOCK WAVE LITHOTRIPSY Left 01/29/2014   Procedure: EXTRACORPOREAL SHOCK WAVE LITHOTRIPSY (ESWL) LEFT RENAL CALCULUS;  Surgeon: Marissa Nestle, MD;  Location: [facility];  Service: Urology;  Laterality: Left;  . FINGER SURGERY Left    due to major cut  . REDUCTION MAMMAPLASTY Bilateral   . TOTAL KNEE ARTHROPLASTY Right   . TOTAL KNEE ARTHROPLASTY Left 05/08/2017   Procedure: LEFT TOTAL KNEE  ARTHROPLASTY;  Surgeon: Gaynelle Arabian, MD;  Location: [facility];  Service: Orthopedics;  Laterality: Left;  with block   Social History   Social History Narrative  . Not on file    There is no immunization history on file for this patient.   Objective: Vital Signs: BP (!) 160/91 (BP Location: Right Arm, Patient Position: Sitting, Cuff Size: Normal)   Pulse 71   Resp 14   Ht 5' 1.5" (1.562 m)   Wt 185 lb (83.9 kg)   BMI 34.39 kg/m    Physical Exam Vitals signs and nursing note reviewed.  Constitutional:      Appearance: She is well-developed.  HENT:     Head: Normocephalic and atraumatic.  Eyes:     Conjunctiva/sclera: Conjunctivae normal.  Neck:     Musculoskeletal: Normal range of motion.  Cardiovascular:     Rate and Rhythm: Normal rate and regular rhythm.     Heart sounds: Normal heart sounds.  Pulmonary:     Effort: Pulmonary effort is normal.     Breath sounds: Normal breath sounds.  Abdominal:     General: Bowel sounds are normal.     Palpations: Abdomen is soft.  Lymphadenopathy:     Cervical: No cervical adenopathy.  Skin:    General: Skin is warm and dry.     Capillary Refill: Capillary refill takes less than 2 seconds.  Neurological:     Mental Status: She is alert and oriented to person, place, and time.  Psychiatric:        Behavior: Behavior normal.      Musculoskeletal Exam: C-spine was in good range of motion.  She has some thoracic kyphosis.  Lumbar spine range of motion was difficult to assess as patient has vertigo.  Shoulder joints were in good range of motion.  Elbow joints and wrist joints with good range of motion with no synovitis.  She has bilateral DIP and PIP thickening and subluxation of some of the DIP joints.  Hip joints with good range of motion.  She has bilateral total knee replacements which appears to be doing well.  She has PIP and DIP thickening in her feet without any synovitis.  CDAI Exam: CDAI Score: - Patient Global: -;  Provider Global: - Swollen: -; Tender: - Joint Exam   No joint exam has been documented for this visit   There is currently no information documented on the homunculus. Go to the Rheumatology activity and complete the homunculus joint exam.  Investigation: No additional findings.  Imaging: Xr Foot 2 Views Left  Result Date: 07/05/2019 First MTP, PIP and DIP narrowing was noted.  No erosive changes were noted.  Subtalar joint space narrowing was noted.  Calcaneal spur was noted.  No intertarsal joint space narrowing was noted. Impression: These findings are consistent with arthritis of the ankle joint with subtalar joint space narrowing and calcaneal spur.  Xr Foot 2 Views Right  Result Date: 07/05/2019 Right first MTP narrowing and subluxation was noted.  PIP and DIP narrowing was noted.  No erosive changes were noted.  No tibiotalar joint space narrowing was noted.  Large calcaneal spur was noted. Impression: These findings are consistent with osteoarthritis of the foot.  Xr Hand 2 View Left  Result Date: 07/05/2019 [facility] PIP and DIP narrowing was noted.  Third MCP narrowing was noted.  No significant intercarpal radiocarpal joint space narrowing was noted.  Generalized osteopenia was noted.  No erosive changes were noted. Impression: These findings are consistent with osteoarthritis although the third MCP narrowing could be seen in inflammatory arthritis.  Xr Hand 2 View Right  Result Date: 07/05/2019 Severe narrowing of PIP and DIP joints with subluxation of all DIPs was noted.  Narrowing of the second and third MCP joints was noted.  No significant metacarpocarpal or radiocarpal joint space narrowing was noted.  Intercarpal joint space narrowing was noted.  No erosive changes were noted.  Some cystic changes were noted. Impression: These findings are consistent with osteoarthritis.  She also has a component of inflammatory arthritis with MCP and intercarpal joint space narrowing which  could be seen with gouty arthropathy.   Recent Labs: Lab Results  Component Value Date   WBC 12.6 (H) 05/10/2017   HGB 11.5 (L) 05/10/2017   PLT 189 05/10/2017   NA 136 05/10/2017   K 3.3 (L) 05/10/2017   CL 100 (L) 05/10/2017   CO2 27 05/10/2017   GLUCOSE 121 (H) 05/10/2017   BUN 15 05/10/2017   CREATININE 0.77 05/10/2017   BILITOT 1.6 (H) 04/28/2017   ALKPHOS 72 04/28/2017   AST 26 04/28/2017   ALT 19 04/28/2017   PROT 7.2 04/28/2017   ALBUMIN 4.4 04/28/2017   CALCIUM 9.0 05/10/2017   GFRAA >60 05/10/2017    Speciality Comments: No specialty comments available.  Procedures:  No procedures performed Allergies: Codeine, Lisinopril, and Morphine and related   Assessment / Plan:     Visit Diagnoses: Pain in both hands -patient complains of pain and discomfort in her bilateral hands and intermittent swelling.  I do not see any synovitis on examination today.  I will obtain x-rays and labs today.  Plan: XR Hand 2 View Right, XR Hand 2 View Left, x-rays were consistent with osteoarthritis and inflammatory arthritis overlap.  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  Sedimentation rate, Uric acid, Rheumatoid factor, Cyclic citrul peptide antibody, IgG, 14-3-3 eta Protein,   Pain in left wrist -patient complains of left wrist joint pain and nocturnal pain in her left wrist.  I do not see any synovitis on examination.    Status post total bilateral knee replacement -doing well.  Pain in both feet -she complains of pain and discomfort in her bilateral ankles and her feet.  No synovitis was noted.  Clinical findings are consistent with osteoarthritis.  Plan: XR Foot 2 Views Right, XR Foot 2 Views Left, x-ray showed arthritis in her left ankle.  And osteoarthritic changes in bilateral feet.  Chronic right shoulder pain -patient complains of nocturnal pain in her right shoulder.  She states because of her vertigo she sleeps on the right shoulder.  The discomfort has been going on for a month is  gradually getting better per patient.  Idiopathic chronic gout of multiple sites without tophus -she gives history of only one episode of gout in the past.  She has been on allopurinol since then.  I will check her uric acid today.  Essential hypertension -her blood pressure is a still elevated.  Have advised her to monitor blood pressure closely and follow-up with the PCP.  Metabolic syndrome -  Plan: Weight loss diet and exercise was discussed.  Mixed hyperlipidemia   NASH (nonalcoholic steatohepatitis)    Orders: Orders Placed This Encounter  Procedures  . XR Hand 2 View Right  . XR Hand 2 View Left  . XR Foot 2 Views Right  . XR Foot 2 Views Left  . Sedimentation rate  . Uric acid  . Rheumatoid factor  . Cyclic citrul peptide antibody, IgG  . 14-3-3 eta Protein   No orders of the defined types were placed in this encounter.   Face-to-face time spent with patient was 45 minutes. Greater than 50% of time was spent in counseling and coordination of care.  Follow-Up Instructions: Return for Osteoarthritis, Gout.   Bo Merino, MD  Note - This record has been created using Editor, commissioning.  Chart creation errors have been sought, but may not always  have been located. Such creation errors do not reflect on  the standard of medical care.

## 2019-06-27 DIAGNOSIS — H8112 Benign paroxysmal vertigo, left ear: Secondary | ICD-10-CM | POA: Diagnosis not present

## 2019-06-27 DIAGNOSIS — Z6834 Body mass index (BMI) 34.0-34.9, adult: Secondary | ICD-10-CM | POA: Diagnosis not present

## 2019-06-27 DIAGNOSIS — M19032 Primary osteoarthritis, left wrist: Secondary | ICD-10-CM | POA: Diagnosis not present

## 2019-07-05 ENCOUNTER — Ambulatory Visit (INDEPENDENT_AMBULATORY_CARE_PROVIDER_SITE_OTHER): Payer: Medicare Other

## 2019-07-05 ENCOUNTER — Encounter: Payer: Self-pay | Admitting: Rheumatology

## 2019-07-05 ENCOUNTER — Ambulatory Visit: Payer: Self-pay

## 2019-07-05 ENCOUNTER — Other Ambulatory Visit: Payer: Self-pay

## 2019-07-05 ENCOUNTER — Ambulatory Visit (INDEPENDENT_AMBULATORY_CARE_PROVIDER_SITE_OTHER): Payer: Medicare Other | Admitting: Rheumatology

## 2019-07-05 VITALS — BP 160/91 | HR 71 | Resp 14 | Ht 61.5 in | Wt 185.0 lb

## 2019-07-05 DIAGNOSIS — M79671 Pain in right foot: Secondary | ICD-10-CM | POA: Diagnosis not present

## 2019-07-05 DIAGNOSIS — M1A09X0 Idiopathic chronic gout, multiple sites, without tophus (tophi): Secondary | ICD-10-CM | POA: Diagnosis not present

## 2019-07-05 DIAGNOSIS — M79672 Pain in left foot: Secondary | ICD-10-CM | POA: Diagnosis not present

## 2019-07-05 DIAGNOSIS — E8881 Metabolic syndrome: Secondary | ICD-10-CM

## 2019-07-05 DIAGNOSIS — M25511 Pain in right shoulder: Secondary | ICD-10-CM

## 2019-07-05 DIAGNOSIS — M79641 Pain in right hand: Secondary | ICD-10-CM

## 2019-07-05 DIAGNOSIS — Z96653 Presence of artificial knee joint, bilateral: Secondary | ICD-10-CM | POA: Diagnosis not present

## 2019-07-05 DIAGNOSIS — I10 Essential (primary) hypertension: Secondary | ICD-10-CM | POA: Diagnosis not present

## 2019-07-05 DIAGNOSIS — G8929 Other chronic pain: Secondary | ICD-10-CM

## 2019-07-05 DIAGNOSIS — M79642 Pain in left hand: Secondary | ICD-10-CM | POA: Diagnosis not present

## 2019-07-05 DIAGNOSIS — M25532 Pain in left wrist: Secondary | ICD-10-CM | POA: Diagnosis not present

## 2019-07-05 DIAGNOSIS — E782 Mixed hyperlipidemia: Secondary | ICD-10-CM | POA: Diagnosis not present

## 2019-07-05 DIAGNOSIS — K7581 Nonalcoholic steatohepatitis (NASH): Secondary | ICD-10-CM | POA: Diagnosis not present

## 2019-07-08 DIAGNOSIS — H8112 Benign paroxysmal vertigo, left ear: Secondary | ICD-10-CM | POA: Diagnosis not present

## 2019-07-08 DIAGNOSIS — Z6834 Body mass index (BMI) 34.0-34.9, adult: Secondary | ICD-10-CM | POA: Diagnosis not present

## 2019-07-08 DIAGNOSIS — M19032 Primary osteoarthritis, left wrist: Secondary | ICD-10-CM | POA: Diagnosis not present

## 2019-07-09 LAB — SEDIMENTATION RATE: Sed Rate: 2 mm/h (ref 0–30)

## 2019-07-09 LAB — CYCLIC CITRUL PEPTIDE ANTIBODY, IGG: Cyclic Citrullin Peptide Ab: <16 UNITS

## 2019-07-09 LAB — URIC ACID: Uric Acid, Serum: 4.4 mg/dL (ref 2.5–7.0)

## 2019-07-09 LAB — 14-3-3 ETA PROTEIN: 14-3-3 eta Protein: <0.2 ng/mL (ref <0.2)

## 2019-07-09 LAB — RHEUMATOID FACTOR: Rhuematoid fact SerPl-aCnc: <14 IU/mL (ref <14)

## 2019-07-10 DIAGNOSIS — M9901 Segmental and somatic dysfunction of cervical region: Secondary | ICD-10-CM | POA: Diagnosis not present

## 2019-07-10 DIAGNOSIS — R42 Dizziness and giddiness: Secondary | ICD-10-CM | POA: Diagnosis not present

## 2019-07-10 DIAGNOSIS — M47812 Spondylosis without myelopathy or radiculopathy, cervical region: Secondary | ICD-10-CM | POA: Diagnosis not present

## 2019-07-11 DIAGNOSIS — M9901 Segmental and somatic dysfunction of cervical region: Secondary | ICD-10-CM | POA: Diagnosis not present

## 2019-07-11 DIAGNOSIS — R42 Dizziness and giddiness: Secondary | ICD-10-CM | POA: Diagnosis not present

## 2019-07-11 DIAGNOSIS — M47812 Spondylosis without myelopathy or radiculopathy, cervical region: Secondary | ICD-10-CM | POA: Diagnosis not present

## 2019-07-15 DIAGNOSIS — R42 Dizziness and giddiness: Secondary | ICD-10-CM | POA: Diagnosis not present

## 2019-07-15 DIAGNOSIS — M47812 Spondylosis without myelopathy or radiculopathy, cervical region: Secondary | ICD-10-CM | POA: Diagnosis not present

## 2019-07-15 DIAGNOSIS — H8112 Benign paroxysmal vertigo, left ear: Secondary | ICD-10-CM | POA: Diagnosis not present

## 2019-07-15 DIAGNOSIS — M9901 Segmental and somatic dysfunction of cervical region: Secondary | ICD-10-CM | POA: Diagnosis not present

## 2019-07-15 DIAGNOSIS — Z6834 Body mass index (BMI) 34.0-34.9, adult: Secondary | ICD-10-CM | POA: Diagnosis not present

## 2019-07-24 NOTE — Progress Notes (Signed)
Office Visit Note  Patient: Karen Tucker             Date of Birth: 1951-11-19           MRN: [id number]             PCP: Caryl Bis, MD Referring: Caryl Bis, MD Visit Date: 08/06/2019 Occupation: @GUAROCC @  Subjective:  Pain in hands and feet   History of Present Illness: Karen Tucker is a 68 y.o. female with history of osteoarthritis.  She continues to have discomfort in her hands and feet.  She states her left ankle joint has been hurting her for a while.  She denies any gout flare.  None of the other joints are painful.  Activities of Daily Living:  Patient reports morning stiffness for 1 hour.   Patient Reports nocturnal pain.  Difficulty dressing/grooming: Denies Difficulty climbing stairs: Reports Difficulty getting out of chair: Denies Difficulty using hands for taps, buttons, cutlery, and/or writing: Reports  Review of Systems  Constitutional: Negative for fatigue, night sweats, weight gain and weight loss.  HENT: Negative for mouth sores, trouble swallowing, trouble swallowing, mouth dryness and nose dryness.   Eyes: Negative for pain, redness, itching, visual disturbance and dryness.  Respiratory: Negative for cough, shortness of breath, wheezing and difficulty breathing.   Cardiovascular: Negative for chest pain, palpitations, hypertension, irregular heartbeat and swelling in legs/feet.  Gastrointestinal: Negative for abdominal pain, blood in stool, constipation and diarrhea.  Endocrine: Negative for increased urination.  Genitourinary: Negative for painful urination and vaginal dryness.  Musculoskeletal: Positive for arthralgias, joint pain and morning stiffness. Negative for joint swelling, myalgias, muscle weakness, muscle tenderness and myalgias.  Skin: Negative for color change, rash, hair loss, redness, skin tightness, ulcers and sensitivity to sunlight.  Allergic/Immunologic: Negative for susceptible to infections.  Neurological:  Negative for dizziness, light-headedness, headaches, memory loss, night sweats and weakness.  Hematological: Negative for bruising/bleeding tendency and swollen glands.  Psychiatric/Behavioral: Negative for depressed mood, confusion and sleep disturbance. The patient is not nervous/anxious.     PMFS History:  Patient Active Problem List   Diagnosis Date Noted  . OA (osteoarthritis) of knee 05/08/2017    Past Medical History:  Diagnosis Date  . Gout   . Hypertension   . PONV (postoperative nausea and vomiting)     Family History  Problem Relation Age of Onset  . Bone cancer Mother   . Heart disease Father   . Cancer Brother   . Cancer Sister   . Healthy Son   . Healthy Son   . Breast cancer Neg Hx    Past Surgical History:  Procedure Laterality Date  . APPENDECTOMY    . CHOLECYSTECTOMY    . EXTRACORPOREAL SHOCK WAVE LITHOTRIPSY Left 01/29/2014   Procedure: EXTRACORPOREAL SHOCK WAVE LITHOTRIPSY (ESWL) LEFT RENAL CALCULUS;  Surgeon: Marissa Nestle, MD;  Location: [facility];  Service: Urology;  Laterality: Left;  . FINGER SURGERY Left    due to major cut  . REDUCTION MAMMAPLASTY Bilateral   . TOTAL KNEE ARTHROPLASTY Right   . TOTAL KNEE ARTHROPLASTY Left 05/08/2017   Procedure: LEFT TOTAL KNEE ARTHROPLASTY;  Surgeon: Gaynelle Arabian, MD;  Location: [facility];  Service: Orthopedics;  Laterality: Left;  with block   Social History   Social History Narrative  . Not on file    There is no immunization history on file for this patient.   Objective: Vital Signs: BP 119/79 (BP Location: Left Arm, Patient  Position: Sitting, Cuff Size: Normal)   Pulse 82   Resp 14   Ht 5\' 2"  (1.575 m)   Wt 186 lb (84.4 kg)   BMI 34.02 kg/m    Physical Exam Vitals signs and nursing note reviewed.  Constitutional:      Appearance: She is well-developed.  HENT:     Head: Normocephalic and atraumatic.  Eyes:     Conjunctiva/sclera: Conjunctivae normal.  Neck:     Musculoskeletal: Normal range  of motion.  Cardiovascular:     Rate and Rhythm: Normal rate and regular rhythm.     Heart sounds: Normal heart sounds.  Pulmonary:     Effort: Pulmonary effort is normal.     Breath sounds: Normal breath sounds.  Abdominal:     General: Bowel sounds are normal.     Palpations: Abdomen is soft.  Lymphadenopathy:     Cervical: No cervical adenopathy.  Skin:    General: Skin is warm and dry.     Capillary Refill: Capillary refill takes less than 2 seconds.  Neurological:     Mental Status: She is alert and oriented to person, place, and time.  Psychiatric:        Behavior: Behavior normal.      Musculoskeletal Exam: C-spine was in good range of motion.  Shoulder joints, elbow joints, wrist joints with good range of motion.  She has bilateral DIP and PIP thickening with subluxation of some of her DIP joints.  Hip joints and knee joints in good range of motion.  She has bilateral hallux rigidus and PIP and DIP thickening.  CDAI Exam: CDAI Score: - Patient Global: -; Provider Global: - Swollen: -; Tender: - Joint Exam   No joint exam has been documented for this visit   There is currently no information documented on the homunculus. Go to the Rheumatology activity and complete the homunculus joint exam.  Investigation: No additional findings.  Imaging: No results found.  Recent Labs: Lab Results  Component Value Date   WBC 12.6 (H) 05/10/2017   HGB 11.5 (L) 05/10/2017   PLT 189 05/10/2017   NA 136 05/10/2017   K 3.3 (L) 05/10/2017   CL 100 (L) 05/10/2017   CO2 27 05/10/2017   GLUCOSE 121 (H) 05/10/2017   BUN 15 05/10/2017   CREATININE 0.77 05/10/2017   BILITOT 1.6 (H) 04/28/2017   ALKPHOS 72 04/28/2017   AST 26 04/28/2017   ALT 19 04/28/2017   PROT 7.2 04/28/2017   ALBUMIN 4.4 04/28/2017   CALCIUM 9.0 05/10/2017   GFRAA >60 05/10/2017  July 05, 2019 sed rate 2, uric acid 4.4, RF negative, anti-CCP negative, 14 3 3  eta negative  Speciality Comments: No  specialty comments available.  Procedures:  No procedures performed Allergies: Codeine, Lisinopril, and Morphine and related   Assessment / Plan:     Visit Diagnoses: Idiopathic chronic gout of multiple sites without tophus-patient has no joint inflammation on examination.  Her uric acid is in desirable range.  Her gout is very well controlled with allopurinol.  Primary osteoarthritis of both hands - X-rays were consistent with inflammatory arthritis and osteoarthritis overlap.  All autoimmune work-up was negative.  At this point I believe that changes in her x-rays with MCP, intercarpal joint space narrowing could be due to underlying gouty arthropathy.  I do not see any synovitis on examination.  These changes could be chronic.  I have advised her if she develops any increased swelling that she should notify me.  For osteoarthritis joint protection muscle strengthening was discussed.  A handout on hand exercises was given.  Some of the hand exercises were demonstrated in the office today.  I have also given her a list of natural supplements.  Primary osteoarthritis of both feet -she has osteoarthritic changes in her feet.  She also has thickening of her left ankle joint with narrowing of the subtalar joint.  It is difficult to say when she developed damage to her ankle joint.  She had no active synovitis on examination.  Weight loss diet and exercise was discussed.  Metabolic syndrome -weight loss and diet was discussed.  Essential hypertension -her blood pressure is under good control.  Mixed hyperlipidemia   NASH (nonalcoholic steatohepatitis)   Orders: No orders of the defined types were placed in this encounter.  No orders of the defined types were placed in this encounter.   Face-to-face time spent with patient was 25 minutes. Greater than 50% of time was spent in counseling and coordination of care.  Follow-Up Instructions: Return if symptoms worsen or fail to improve, for  Osteoarthritis.   Bo Merino, MD  Note - This record has been created using Editor, commissioning.  Chart creation errors have been sought, but may not always  have been located. Such creation errors do not reflect on  the standard of medical care.

## 2019-08-06 ENCOUNTER — Ambulatory Visit (INDEPENDENT_AMBULATORY_CARE_PROVIDER_SITE_OTHER): Payer: Medicare Other | Admitting: Rheumatology

## 2019-08-06 ENCOUNTER — Encounter: Payer: Self-pay | Admitting: Rheumatology

## 2019-08-06 ENCOUNTER — Other Ambulatory Visit: Payer: Self-pay

## 2019-08-06 VITALS — BP 119/79 | HR 82 | Resp 14 | Ht 62.0 in | Wt 186.0 lb

## 2019-08-06 DIAGNOSIS — I10 Essential (primary) hypertension: Secondary | ICD-10-CM | POA: Diagnosis not present

## 2019-08-06 DIAGNOSIS — M19042 Primary osteoarthritis, left hand: Secondary | ICD-10-CM | POA: Diagnosis not present

## 2019-08-06 DIAGNOSIS — M1A09X0 Idiopathic chronic gout, multiple sites, without tophus (tophi): Secondary | ICD-10-CM | POA: Diagnosis not present

## 2019-08-06 DIAGNOSIS — M19071 Primary osteoarthritis, right ankle and foot: Secondary | ICD-10-CM | POA: Diagnosis not present

## 2019-08-06 DIAGNOSIS — K7581 Nonalcoholic steatohepatitis (NASH): Secondary | ICD-10-CM | POA: Diagnosis not present

## 2019-08-06 DIAGNOSIS — E782 Mixed hyperlipidemia: Secondary | ICD-10-CM

## 2019-08-06 DIAGNOSIS — M19072 Primary osteoarthritis, left ankle and foot: Secondary | ICD-10-CM | POA: Diagnosis not present

## 2019-08-06 DIAGNOSIS — M19041 Primary osteoarthritis, right hand: Secondary | ICD-10-CM

## 2019-08-06 DIAGNOSIS — E8881 Metabolic syndrome: Secondary | ICD-10-CM | POA: Diagnosis not present

## 2019-08-06 NOTE — Patient Instructions (Signed)
Hand Exercises °Hand exercises can be helpful for almost anyone. These exercises can strengthen the hands, improve flexibility and movement, and increase blood flow to the hands. These results can make work and daily tasks easier. Hand exercises can be especially helpful for people who have joint pain from arthritis or have nerve damage from overuse (carpal tunnel syndrome). These exercises can also help people who have injured a hand. °Exercises °Most of these hand exercises are gentle stretching and motion exercises. It is usually safe to do them often throughout the day. Warming up your hands before exercise may help to reduce stiffness. You can do this with gentle massage or by placing your hands in warm water for 10-15 minutes. °It is normal to feel some stretching, pulling, tightness, or mild discomfort as you begin new exercises. This will gradually improve. Stop an exercise right away if you feel sudden, severe pain or your pain gets worse. Ask your health care provider which exercises are best for you. °Knuckle bend or "claw" fist °1. Stand or sit with your arm, hand, and all five fingers pointed straight up. Make sure to keep your wrist straight during the exercise. °2. Gently bend your fingers down toward your palm until the tips of your fingers are touching the top of your palm. Keep your big knuckle straight and just bend the small knuckles in your fingers. °3. Hold this position for __________ seconds. °4. Straighten (extend) your fingers back to the starting position. °Repeat this exercise 5-10 times with each hand. °Full finger fist °1. Stand or sit with your arm, hand, and all five fingers pointed straight up. Make sure to keep your wrist straight during the exercise. °2. Gently bend your fingers into your palm until the tips of your fingers are touching the middle of your palm. °3. Hold this position for __________ seconds. °4. Extend your fingers back to the starting position, stretching every  joint fully. °Repeat this exercise 5-10 times with each hand. °Straight fist °1. Stand or sit with your arm, hand, and all five fingers pointed straight up. Make sure to keep your wrist straight during the exercise. °2. Gently bend your fingers at the big knuckle, where your fingers meet your hand, and the middle knuckle. Keep the knuckle at the tips of your fingers straight and try to touch the bottom of your palm. °3. Hold this position for __________ seconds. °4. Extend your fingers back to the starting position, stretching every joint fully. °Repeat this exercise 5-10 times with each hand. °Tabletop °1. Stand or sit with your arm, hand, and all five fingers pointed straight up. Make sure to keep your wrist straight during the exercise. °2. Gently bend your fingers at the big knuckle, where your fingers meet your hand, as far down as you can while keeping the small knuckles in your fingers straight. Think of forming a tabletop with your fingers. °3. Hold this position for __________ seconds. °4. Extend your fingers back to the starting position, stretching every joint fully. °Repeat this exercise 5-10 times with each hand. °Finger spread °1. Place your hand flat on a table with your palm facing down. Make sure your wrist stays straight as you do this exercise. °2. Spread your fingers and thumb apart from each other as far as you can until you feel a gentle stretch. Hold this position for __________ seconds. °3. Bring your fingers and thumb tight together again. Hold this position for __________ seconds. °Repeat this exercise 5-10 times with each hand. °  Making circles °1. Stand or sit with your arm, hand, and all five fingers pointed straight up. Make sure to keep your wrist straight during the exercise. °2. Make a circle by touching the tip of your thumb to the tip of your index finger. °3. Hold for __________ seconds. Then open your hand wide. °4. Repeat this motion with your thumb and each finger on your  hand. °Repeat this exercise 5-10 times with each hand. °Thumb motion °1. Sit with your forearm resting on a table and your wrist straight. Your thumb should be facing up toward the ceiling. Keep your fingers relaxed as you move your thumb. °2. Lift your thumb up as high as you can toward the ceiling. Hold for __________ seconds. °3. Bend your thumb across your palm as far as you can, reaching the tip of your thumb for the small finger (pinkie) side of your palm. Hold for __________ seconds. °Repeat this exercise 5-10 times with each hand. °Grip strengthening ° °1. Hold a stress ball or other soft ball in the middle of your hand. °2. Slowly increase the pressure, squeezing the ball as much as you can without causing pain. Think of bringing the tips of your fingers into the middle of your palm. All of your finger joints should bend when doing this exercise. °3. Hold your squeeze for __________ seconds, then relax. °Repeat this exercise 5-10 times with each hand. °Contact a health care provider if: °· Your hand pain or discomfort gets much worse when you do an exercise. °· Your hand pain or discomfort does not improve within 2 hours after you exercise. °If you have any of these problems, stop doing these exercises right away. Do not do them again unless your health care provider says that you can. °Get help right away if: °· You develop sudden, severe hand pain or swelling. If this happens, stop doing these exercises right away. Do not do them again unless your health care provider says that you can. °This information is not intended to replace advice given to you by your health care provider. Make sure you discuss any questions you have with your health care provider. °Document Released: 11/23/2015 Document Revised: 04/04/2019 Document Reviewed: 12/13/2018 °Elsevier Patient Education © 2020 [employer] ° °

## 2019-08-06 NOTE — Progress Notes (Signed)
Pharmacy Note  Subjective:  Patient presents today to the [facility] to see Dr. Estanislado Pandy.   Patient seen by the pharmacist for counseling on natural anti-inflammatories.  Objective: Current Outpatient Medications on File Prior to Visit  Medication Sig Dispense Refill  . acetaminophen (TYLENOL) 500 MG tablet Take 500 mg by mouth 2 (two) times daily as needed for moderate pain.    Marland Kitchen allopurinol (ZYLOPRIM) 300 MG tablet Take 300 mg by mouth daily.    Marland Kitchen losartan (COZAAR) 100 MG tablet Take 100 mg by mouth daily.    . Naproxen Sodium (ALEVE PO) Take by mouth as needed.    . potassium chloride (K-DUR) 10 MEQ tablet Take 50 mEq by mouth daily. Takes 5 tablets daily    . HYDROmorphone (DILAUDID) 2 MG tablet Take 1-2 tablets (2-4 mg total) by mouth every 4 (four) hours as needed for severe pain. (Patient not taking: Reported on 07/05/2019) 84 tablet 0  . methocarbamol (ROBAXIN) 500 MG tablet Take 1 tablet (500 mg total) by mouth every 6 (six) hours as needed for muscle spasms. (Patient not taking: Reported on 07/05/2019) 80 tablet 0  . ondansetron (ZOFRAN) 4 MG tablet Take 1 tablet (4 mg total) by mouth every 6 (six) hours as needed for nausea. (Patient not taking: Reported on 08/06/2019) 40 tablet 0  . rivaroxaban (XARELTO) 10 MG TABS tablet Take 1 tablet (10 mg total) by mouth daily with breakfast. Take Xarelto for two and a half more weeks following discharge from the hospital, then discontinue Xarelto. Once the patient has completed the blood thinner regimen, then take a Baby 81 mg Aspirin daily for three more weeks. (Patient not taking: Reported on 07/05/2019) 19 tablet 0  . traMADol (ULTRAM) 50 MG tablet Take 1-2 tablets (50-100 mg total) by mouth every 6 (six) hours as needed for moderate pain. (Patient not taking: Reported on 07/05/2019) 56 tablet 0   No current facility-administered medications on file prior to visit.      Assessment/Plan:  Counseled on the purpose, proper use, and  adverse effects of natural anti-inflammatories including upset stomach and increased bleeding risk.  Encouraged patient to add one medication at a time and to include on medication list to monitor for adverse effects and drug interactions.  Given educational handout with recommended doses.  All questions encouraged and answered.  Instructed patient to call with any further questions or concerns.  Mariella Saa, PharmD, [place] Pharmacist [phone]  08/06/2019 2:27 PM

## 2019-08-23 DIAGNOSIS — H2513 Age-related nuclear cataract, bilateral: Secondary | ICD-10-CM | POA: Diagnosis not present

## 2019-08-23 DIAGNOSIS — G43109 Migraine with aura, not intractable, without status migrainosus: Secondary | ICD-10-CM | POA: Diagnosis not present

## 2019-08-26 DIAGNOSIS — E782 Mixed hyperlipidemia: Secondary | ICD-10-CM | POA: Diagnosis not present

## 2019-08-26 DIAGNOSIS — R7301 Impaired fasting glucose: Secondary | ICD-10-CM | POA: Diagnosis not present

## 2019-08-26 DIAGNOSIS — E8881 Metabolic syndrome: Secondary | ICD-10-CM | POA: Diagnosis not present

## 2019-08-26 DIAGNOSIS — K7581 Nonalcoholic steatohepatitis (NASH): Secondary | ICD-10-CM | POA: Diagnosis not present

## 2019-08-26 DIAGNOSIS — I10 Essential (primary) hypertension: Secondary | ICD-10-CM | POA: Diagnosis not present

## 2019-09-19 DIAGNOSIS — E8881 Metabolic syndrome: Secondary | ICD-10-CM | POA: Diagnosis not present

## 2019-09-19 DIAGNOSIS — Z6834 Body mass index (BMI) 34.0-34.9, adult: Secondary | ICD-10-CM | POA: Diagnosis not present

## 2019-09-19 DIAGNOSIS — K7581 Nonalcoholic steatohepatitis (NASH): Secondary | ICD-10-CM | POA: Diagnosis not present

## 2019-09-19 DIAGNOSIS — M19032 Primary osteoarthritis, left wrist: Secondary | ICD-10-CM | POA: Diagnosis not present

## 2019-09-19 DIAGNOSIS — E876 Hypokalemia: Secondary | ICD-10-CM | POA: Diagnosis not present

## 2019-09-19 DIAGNOSIS — E782 Mixed hyperlipidemia: Secondary | ICD-10-CM | POA: Diagnosis not present

## 2019-09-19 DIAGNOSIS — I10 Essential (primary) hypertension: Secondary | ICD-10-CM | POA: Diagnosis not present

## 2019-09-19 DIAGNOSIS — Z23 Encounter for immunization: Secondary | ICD-10-CM | POA: Diagnosis not present

## 2019-10-03 DIAGNOSIS — M9901 Segmental and somatic dysfunction of cervical region: Secondary | ICD-10-CM | POA: Diagnosis not present

## 2019-10-03 DIAGNOSIS — M47812 Spondylosis without myelopathy or radiculopathy, cervical region: Secondary | ICD-10-CM | POA: Diagnosis not present

## 2019-10-03 DIAGNOSIS — R42 Dizziness and giddiness: Secondary | ICD-10-CM | POA: Diagnosis not present

## 2019-11-25 DIAGNOSIS — I10 Essential (primary) hypertension: Secondary | ICD-10-CM | POA: Diagnosis not present

## 2019-11-25 DIAGNOSIS — E782 Mixed hyperlipidemia: Secondary | ICD-10-CM | POA: Diagnosis not present

## 2019-11-26 ENCOUNTER — Other Ambulatory Visit: Payer: Self-pay | Admitting: Family Medicine

## 2019-11-26 DIAGNOSIS — Z1231 Encounter for screening mammogram for malignant neoplasm of breast: Secondary | ICD-10-CM

## 2020-01-01 DIAGNOSIS — M545 Low back pain: Secondary | ICD-10-CM | POA: Diagnosis not present

## 2020-01-16 ENCOUNTER — Ambulatory Visit: Payer: Medicare Other

## 2020-02-05 DIAGNOSIS — Z86010 Personal history of colonic polyps: Secondary | ICD-10-CM | POA: Diagnosis not present

## 2020-02-05 DIAGNOSIS — K529 Noninfective gastroenteritis and colitis, unspecified: Secondary | ICD-10-CM | POA: Diagnosis not present

## 2020-02-06 ENCOUNTER — Other Ambulatory Visit: Payer: Self-pay

## 2020-02-06 ENCOUNTER — Ambulatory Visit
Admission: RE | Admit: 2020-02-06 | Discharge: 2020-02-06 | Disposition: A | Discharge status: Home / Self Care | Payer: Medicare Other | Source: Ambulatory Visit | Attending: Family Medicine | Admitting: Family Medicine

## 2020-02-06 DIAGNOSIS — Z1231 Encounter for screening mammogram for malignant neoplasm of breast: Secondary | ICD-10-CM | POA: Diagnosis not present

## 2020-02-21 DIAGNOSIS — I10 Essential (primary) hypertension: Secondary | ICD-10-CM | POA: Diagnosis not present

## 2020-02-21 DIAGNOSIS — E7849 Other hyperlipidemia: Secondary | ICD-10-CM | POA: Diagnosis not present

## 2020-02-28 DIAGNOSIS — Z1159 Encounter for screening for other viral diseases: Secondary | ICD-10-CM | POA: Diagnosis not present

## 2020-03-04 DIAGNOSIS — K5730 Diverticulosis of large intestine without perforation or abscess without bleeding: Secondary | ICD-10-CM | POA: Diagnosis not present

## 2020-03-04 DIAGNOSIS — Z86010 Personal history of colonic polyps: Secondary | ICD-10-CM | POA: Diagnosis not present

## 2020-03-16 DIAGNOSIS — H353111 Nonexudative age-related macular degeneration, right eye, early dry stage: Secondary | ICD-10-CM | POA: Diagnosis not present

## 2020-03-16 DIAGNOSIS — H524 Presbyopia: Secondary | ICD-10-CM | POA: Diagnosis not present

## 2020-03-19 DIAGNOSIS — R7301 Impaired fasting glucose: Secondary | ICD-10-CM | POA: Diagnosis not present

## 2020-03-19 DIAGNOSIS — E782 Mixed hyperlipidemia: Secondary | ICD-10-CM | POA: Diagnosis not present

## 2020-03-19 DIAGNOSIS — R5383 Other fatigue: Secondary | ICD-10-CM | POA: Diagnosis not present

## 2020-03-19 DIAGNOSIS — I10 Essential (primary) hypertension: Secondary | ICD-10-CM | POA: Diagnosis not present

## 2020-03-20 DIAGNOSIS — J986 Disorders of diaphragm: Secondary | ICD-10-CM | POA: Diagnosis not present

## 2020-03-20 DIAGNOSIS — S20219A Contusion of unspecified front wall of thorax, initial encounter: Secondary | ICD-10-CM | POA: Diagnosis not present

## 2020-03-25 DIAGNOSIS — S299XXA Unspecified injury of thorax, initial encounter: Secondary | ICD-10-CM | POA: Diagnosis not present

## 2020-03-25 DIAGNOSIS — I700 Atherosclerosis of aorta: Secondary | ICD-10-CM | POA: Diagnosis not present

## 2020-03-25 DIAGNOSIS — E7849 Other hyperlipidemia: Secondary | ICD-10-CM | POA: Diagnosis not present

## 2020-03-25 DIAGNOSIS — R918 Other nonspecific abnormal finding of lung field: Secondary | ICD-10-CM | POA: Diagnosis not present

## 2020-03-25 DIAGNOSIS — S2241XA Multiple fractures of ribs, right side, initial encounter for closed fracture: Secondary | ICD-10-CM | POA: Diagnosis not present

## 2020-03-25 DIAGNOSIS — N200 Calculus of kidney: Secondary | ICD-10-CM | POA: Diagnosis not present

## 2020-03-25 DIAGNOSIS — I10 Essential (primary) hypertension: Secondary | ICD-10-CM | POA: Diagnosis not present

## 2020-03-25 DIAGNOSIS — Z87828 Personal history of other (healed) physical injury and trauma: Secondary | ICD-10-CM | POA: Diagnosis not present

## 2020-04-03 DIAGNOSIS — Z0001 Encounter for general adult medical examination with abnormal findings: Secondary | ICD-10-CM | POA: Diagnosis not present

## 2020-04-03 DIAGNOSIS — I700 Atherosclerosis of aorta: Secondary | ICD-10-CM | POA: Diagnosis not present

## 2020-04-03 DIAGNOSIS — E1169 Type 2 diabetes mellitus with other specified complication: Secondary | ICD-10-CM | POA: Diagnosis not present

## 2020-04-03 DIAGNOSIS — M25532 Pain in left wrist: Secondary | ICD-10-CM | POA: Diagnosis not present

## 2020-04-03 DIAGNOSIS — E782 Mixed hyperlipidemia: Secondary | ICD-10-CM | POA: Diagnosis not present

## 2020-04-03 DIAGNOSIS — I10 Essential (primary) hypertension: Secondary | ICD-10-CM | POA: Diagnosis not present

## 2020-04-20 DIAGNOSIS — M19032 Primary osteoarthritis, left wrist: Secondary | ICD-10-CM | POA: Diagnosis not present

## 2020-04-20 DIAGNOSIS — R55 Syncope and collapse: Secondary | ICD-10-CM | POA: Diagnosis not present

## 2020-04-24 DIAGNOSIS — I10 Essential (primary) hypertension: Secondary | ICD-10-CM | POA: Diagnosis not present

## 2020-04-24 DIAGNOSIS — E7849 Other hyperlipidemia: Secondary | ICD-10-CM | POA: Diagnosis not present

## 2020-06-24 DIAGNOSIS — E876 Hypokalemia: Secondary | ICD-10-CM | POA: Diagnosis not present

## 2020-06-24 DIAGNOSIS — E7849 Other hyperlipidemia: Secondary | ICD-10-CM | POA: Diagnosis not present

## 2020-06-24 DIAGNOSIS — E1169 Type 2 diabetes mellitus with other specified complication: Secondary | ICD-10-CM | POA: Diagnosis not present

## 2020-06-24 DIAGNOSIS — I10 Essential (primary) hypertension: Secondary | ICD-10-CM | POA: Diagnosis not present

## 2020-07-14 DIAGNOSIS — R05 Cough: Secondary | ICD-10-CM | POA: Diagnosis not present

## 2020-07-14 DIAGNOSIS — R509 Fever, unspecified: Secondary | ICD-10-CM | POA: Diagnosis not present

## 2020-07-24 DIAGNOSIS — E876 Hypokalemia: Secondary | ICD-10-CM | POA: Diagnosis not present

## 2020-07-24 DIAGNOSIS — E7849 Other hyperlipidemia: Secondary | ICD-10-CM | POA: Diagnosis not present

## 2020-07-24 DIAGNOSIS — I10 Essential (primary) hypertension: Secondary | ICD-10-CM | POA: Diagnosis not present

## 2020-07-24 DIAGNOSIS — E1169 Type 2 diabetes mellitus with other specified complication: Secondary | ICD-10-CM | POA: Diagnosis not present

## 2020-08-01 DIAGNOSIS — J479 Bronchiectasis, uncomplicated: Secondary | ICD-10-CM | POA: Diagnosis not present

## 2020-08-01 DIAGNOSIS — I10 Essential (primary) hypertension: Secondary | ICD-10-CM | POA: Diagnosis not present

## 2020-08-25 DIAGNOSIS — E876 Hypokalemia: Secondary | ICD-10-CM | POA: Diagnosis not present

## 2020-08-25 DIAGNOSIS — I10 Essential (primary) hypertension: Secondary | ICD-10-CM | POA: Diagnosis not present

## 2020-08-25 DIAGNOSIS — E7849 Other hyperlipidemia: Secondary | ICD-10-CM | POA: Diagnosis not present

## 2020-08-25 DIAGNOSIS — E1169 Type 2 diabetes mellitus with other specified complication: Secondary | ICD-10-CM | POA: Diagnosis not present

## 2020-09-07 DIAGNOSIS — D1801 Hemangioma of skin and subcutaneous tissue: Secondary | ICD-10-CM | POA: Diagnosis not present

## 2020-09-07 DIAGNOSIS — L570 Actinic keratosis: Secondary | ICD-10-CM | POA: Diagnosis not present

## 2020-09-08 DIAGNOSIS — R5383 Other fatigue: Secondary | ICD-10-CM | POA: Diagnosis not present

## 2020-09-08 DIAGNOSIS — I10 Essential (primary) hypertension: Secondary | ICD-10-CM | POA: Diagnosis not present

## 2020-09-08 DIAGNOSIS — E1169 Type 2 diabetes mellitus with other specified complication: Secondary | ICD-10-CM | POA: Diagnosis not present

## 2020-09-08 DIAGNOSIS — E782 Mixed hyperlipidemia: Secondary | ICD-10-CM | POA: Diagnosis not present

## 2020-09-12 DIAGNOSIS — N200 Calculus of kidney: Secondary | ICD-10-CM | POA: Diagnosis not present

## 2020-09-13 DIAGNOSIS — M6250 Muscle wasting and atrophy, not elsewhere classified, unspecified site: Secondary | ICD-10-CM | POA: Diagnosis not present

## 2020-09-13 DIAGNOSIS — Z888 Allergy status to other drugs, medicaments and biological substances status: Secondary | ICD-10-CM | POA: Diagnosis not present

## 2020-09-13 DIAGNOSIS — Z885 Allergy status to narcotic agent status: Secondary | ICD-10-CM | POA: Diagnosis not present

## 2020-09-13 DIAGNOSIS — Z743 Need for continuous supervision: Secondary | ICD-10-CM | POA: Diagnosis not present

## 2020-09-13 DIAGNOSIS — I10 Essential (primary) hypertension: Secondary | ICD-10-CM | POA: Diagnosis not present

## 2020-09-13 DIAGNOSIS — N200 Calculus of kidney: Secondary | ICD-10-CM | POA: Diagnosis not present

## 2020-09-13 DIAGNOSIS — K5730 Diverticulosis of large intestine without perforation or abscess without bleeding: Secondary | ICD-10-CM | POA: Diagnosis not present

## 2020-09-13 DIAGNOSIS — I700 Atherosclerosis of aorta: Secondary | ICD-10-CM | POA: Diagnosis not present

## 2020-09-13 DIAGNOSIS — M549 Dorsalgia, unspecified: Secondary | ICD-10-CM | POA: Diagnosis not present

## 2020-09-13 DIAGNOSIS — N12 Tubulo-interstitial nephritis, not specified as acute or chronic: Secondary | ICD-10-CM | POA: Diagnosis not present

## 2020-09-13 DIAGNOSIS — D72829 Elevated white blood cell count, unspecified: Secondary | ICD-10-CM | POA: Diagnosis not present

## 2020-09-13 DIAGNOSIS — N132 Hydronephrosis with renal and ureteral calculous obstruction: Secondary | ICD-10-CM | POA: Diagnosis not present

## 2020-09-13 DIAGNOSIS — Z9989 Dependence on other enabling machines and devices: Secondary | ICD-10-CM | POA: Diagnosis not present

## 2020-09-13 DIAGNOSIS — R319 Hematuria, unspecified: Secondary | ICD-10-CM | POA: Diagnosis not present

## 2020-09-13 DIAGNOSIS — M109 Gout, unspecified: Secondary | ICD-10-CM | POA: Diagnosis not present

## 2020-09-13 DIAGNOSIS — N179 Acute kidney failure, unspecified: Secondary | ICD-10-CM | POA: Diagnosis not present

## 2020-09-13 DIAGNOSIS — R52 Pain, unspecified: Secondary | ICD-10-CM | POA: Diagnosis not present

## 2020-09-13 DIAGNOSIS — R110 Nausea: Secondary | ICD-10-CM | POA: Diagnosis not present

## 2020-09-15 DIAGNOSIS — E782 Mixed hyperlipidemia: Secondary | ICD-10-CM | POA: Diagnosis not present

## 2020-09-15 DIAGNOSIS — I10 Essential (primary) hypertension: Secondary | ICD-10-CM | POA: Diagnosis not present

## 2020-09-15 DIAGNOSIS — N132 Hydronephrosis with renal and ureteral calculous obstruction: Secondary | ICD-10-CM | POA: Diagnosis not present

## 2020-09-15 DIAGNOSIS — Z792 Long term (current) use of antibiotics: Secondary | ICD-10-CM | POA: Diagnosis not present

## 2020-09-15 DIAGNOSIS — E876 Hypokalemia: Secondary | ICD-10-CM | POA: Diagnosis not present

## 2020-09-15 DIAGNOSIS — R109 Unspecified abdominal pain: Secondary | ICD-10-CM | POA: Diagnosis not present

## 2020-09-15 DIAGNOSIS — K7581 Nonalcoholic steatohepatitis (NASH): Secondary | ICD-10-CM | POA: Diagnosis not present

## 2020-09-15 DIAGNOSIS — N200 Calculus of kidney: Secondary | ICD-10-CM | POA: Diagnosis not present

## 2020-09-15 DIAGNOSIS — N201 Calculus of ureter: Secondary | ICD-10-CM | POA: Diagnosis not present

## 2020-09-15 DIAGNOSIS — Z885 Allergy status to narcotic agent status: Secondary | ICD-10-CM | POA: Diagnosis not present

## 2020-09-15 DIAGNOSIS — E1169 Type 2 diabetes mellitus with other specified complication: Secondary | ICD-10-CM | POA: Diagnosis not present

## 2020-09-15 DIAGNOSIS — R110 Nausea: Secondary | ICD-10-CM | POA: Diagnosis not present

## 2020-09-15 DIAGNOSIS — M109 Gout, unspecified: Secondary | ICD-10-CM | POA: Diagnosis not present

## 2020-09-16 DIAGNOSIS — N200 Calculus of kidney: Secondary | ICD-10-CM | POA: Diagnosis not present

## 2020-09-16 DIAGNOSIS — N201 Calculus of ureter: Secondary | ICD-10-CM | POA: Diagnosis not present

## 2020-09-21 DIAGNOSIS — Z466 Encounter for fitting and adjustment of urinary device: Secondary | ICD-10-CM | POA: Diagnosis not present

## 2020-09-24 DIAGNOSIS — E1169 Type 2 diabetes mellitus with other specified complication: Secondary | ICD-10-CM | POA: Diagnosis not present

## 2020-09-24 DIAGNOSIS — E876 Hypokalemia: Secondary | ICD-10-CM | POA: Diagnosis not present

## 2020-09-24 DIAGNOSIS — I10 Essential (primary) hypertension: Secondary | ICD-10-CM | POA: Diagnosis not present

## 2020-09-24 DIAGNOSIS — E7849 Other hyperlipidemia: Secondary | ICD-10-CM | POA: Diagnosis not present

## 2020-09-25 DIAGNOSIS — Z23 Encounter for immunization: Secondary | ICD-10-CM | POA: Diagnosis not present

## 2020-09-25 DIAGNOSIS — B370 Candidal stomatitis: Secondary | ICD-10-CM | POA: Diagnosis not present

## 2020-12-03 DIAGNOSIS — Z23 Encounter for immunization: Secondary | ICD-10-CM | POA: Diagnosis not present

## 2020-12-14 DIAGNOSIS — N200 Calculus of kidney: Secondary | ICD-10-CM | POA: Diagnosis not present

## 2020-12-25 DIAGNOSIS — E7849 Other hyperlipidemia: Secondary | ICD-10-CM | POA: Diagnosis not present

## 2020-12-25 DIAGNOSIS — E1169 Type 2 diabetes mellitus with other specified complication: Secondary | ICD-10-CM | POA: Diagnosis not present

## 2020-12-25 DIAGNOSIS — I10 Essential (primary) hypertension: Secondary | ICD-10-CM | POA: Diagnosis not present

## 2020-12-25 DIAGNOSIS — E876 Hypokalemia: Secondary | ICD-10-CM | POA: Diagnosis not present

## 2021-01-06 DIAGNOSIS — I10 Essential (primary) hypertension: Secondary | ICD-10-CM | POA: Diagnosis not present

## 2021-01-06 DIAGNOSIS — E782 Mixed hyperlipidemia: Secondary | ICD-10-CM | POA: Diagnosis not present

## 2021-01-06 DIAGNOSIS — E1169 Type 2 diabetes mellitus with other specified complication: Secondary | ICD-10-CM | POA: Diagnosis not present

## 2021-01-08 DIAGNOSIS — N200 Calculus of kidney: Secondary | ICD-10-CM | POA: Diagnosis not present

## 2021-01-11 DIAGNOSIS — I10 Essential (primary) hypertension: Secondary | ICD-10-CM | POA: Diagnosis not present

## 2021-01-11 DIAGNOSIS — E876 Hypokalemia: Secondary | ICD-10-CM | POA: Diagnosis not present

## 2021-01-11 DIAGNOSIS — M19032 Primary osteoarthritis, left wrist: Secondary | ICD-10-CM | POA: Diagnosis not present

## 2021-01-11 DIAGNOSIS — J479 Bronchiectasis, uncomplicated: Secondary | ICD-10-CM | POA: Diagnosis not present

## 2021-01-11 DIAGNOSIS — E1169 Type 2 diabetes mellitus with other specified complication: Secondary | ICD-10-CM | POA: Diagnosis not present

## 2021-01-11 DIAGNOSIS — I700 Atherosclerosis of aorta: Secondary | ICD-10-CM | POA: Diagnosis not present

## 2021-01-11 DIAGNOSIS — E7849 Other hyperlipidemia: Secondary | ICD-10-CM | POA: Diagnosis not present

## 2021-01-15 ENCOUNTER — Other Ambulatory Visit: Payer: Self-pay | Admitting: Family Medicine

## 2021-01-15 DIAGNOSIS — Z1231 Encounter for screening mammogram for malignant neoplasm of breast: Secondary | ICD-10-CM

## 2021-01-20 DIAGNOSIS — Z23 Encounter for immunization: Secondary | ICD-10-CM | POA: Diagnosis not present

## 2021-01-23 DIAGNOSIS — E876 Hypokalemia: Secondary | ICD-10-CM | POA: Diagnosis not present

## 2021-01-23 DIAGNOSIS — E7849 Other hyperlipidemia: Secondary | ICD-10-CM | POA: Diagnosis not present

## 2021-01-23 DIAGNOSIS — I10 Essential (primary) hypertension: Secondary | ICD-10-CM | POA: Diagnosis not present

## 2021-01-23 DIAGNOSIS — E1169 Type 2 diabetes mellitus with other specified complication: Secondary | ICD-10-CM | POA: Diagnosis not present

## 2021-01-28 DIAGNOSIS — R82992 Hyperoxaluria: Secondary | ICD-10-CM | POA: Diagnosis not present

## 2021-01-28 DIAGNOSIS — R82991 Hypocitraturia: Secondary | ICD-10-CM | POA: Diagnosis not present

## 2021-01-28 DIAGNOSIS — N200 Calculus of kidney: Secondary | ICD-10-CM | POA: Diagnosis not present

## 2021-02-22 DIAGNOSIS — E1169 Type 2 diabetes mellitus with other specified complication: Secondary | ICD-10-CM | POA: Diagnosis not present

## 2021-02-22 DIAGNOSIS — E876 Hypokalemia: Secondary | ICD-10-CM | POA: Diagnosis not present

## 2021-02-22 DIAGNOSIS — I10 Essential (primary) hypertension: Secondary | ICD-10-CM | POA: Diagnosis not present

## 2021-02-22 DIAGNOSIS — E7849 Other hyperlipidemia: Secondary | ICD-10-CM | POA: Diagnosis not present

## 2021-03-02 ENCOUNTER — Inpatient Hospital Stay: Admission: RE | Admit: 2021-03-02 | Payer: Medicare Other | Source: Ambulatory Visit

## 2021-03-03 IMAGING — MG DIGITAL SCREENING BILAT W/ TOMO W/ CAD
8 series · 8 of 24 positions shown · non-contrast
Comparison: Previous exam(s).

CLINICAL DATA: Screening.

EXAM:
DIGITAL SCREENING BILATERAL MAMMOGRAM WITH TOMO AND CAD

[L MLO synth-2D]
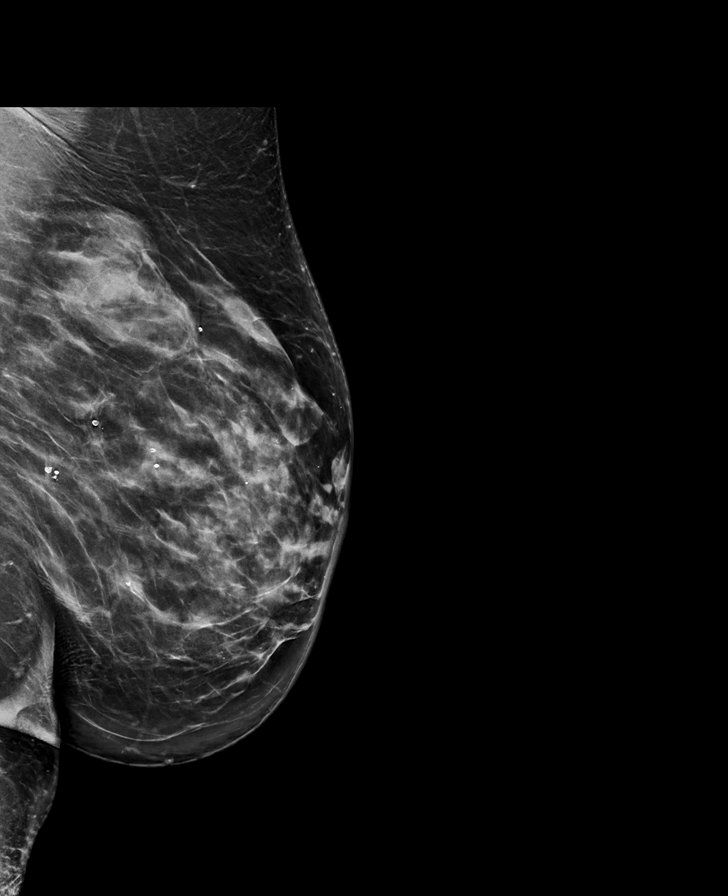

[R MLO synth-2D]
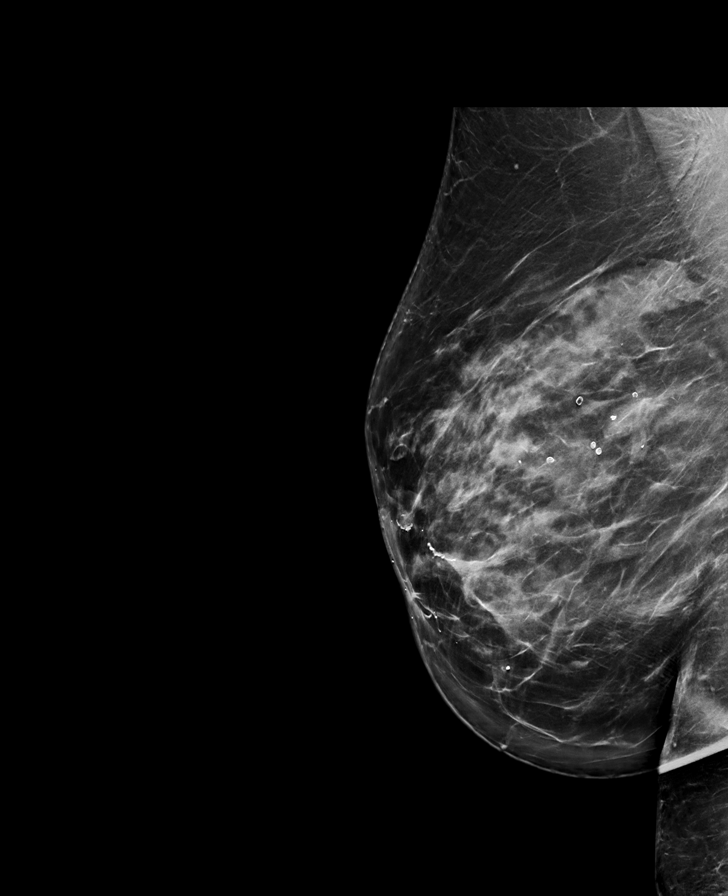

[R CC synth-2D]
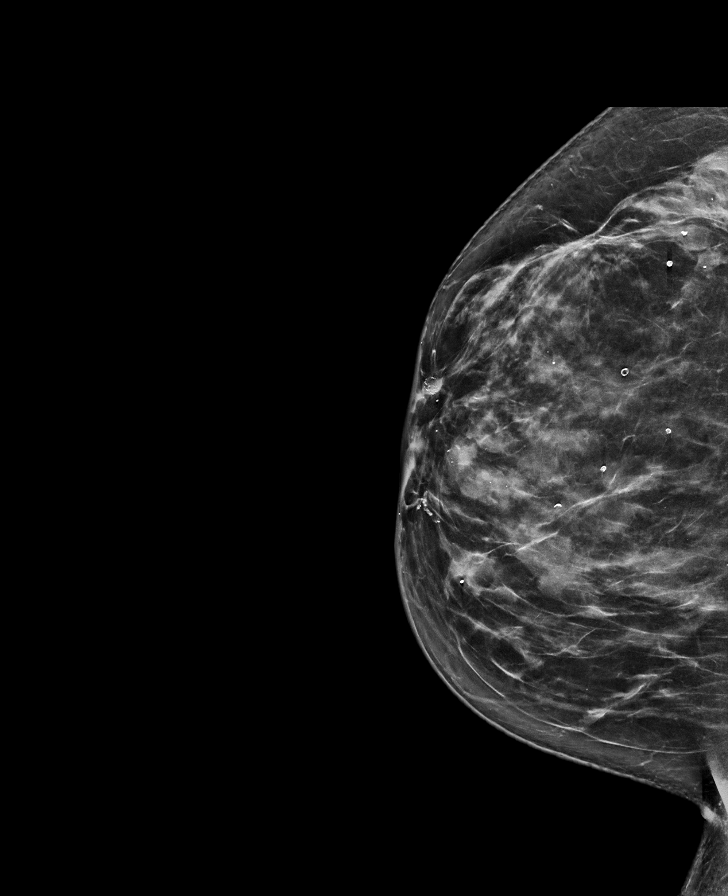

[L CC synth-2D]
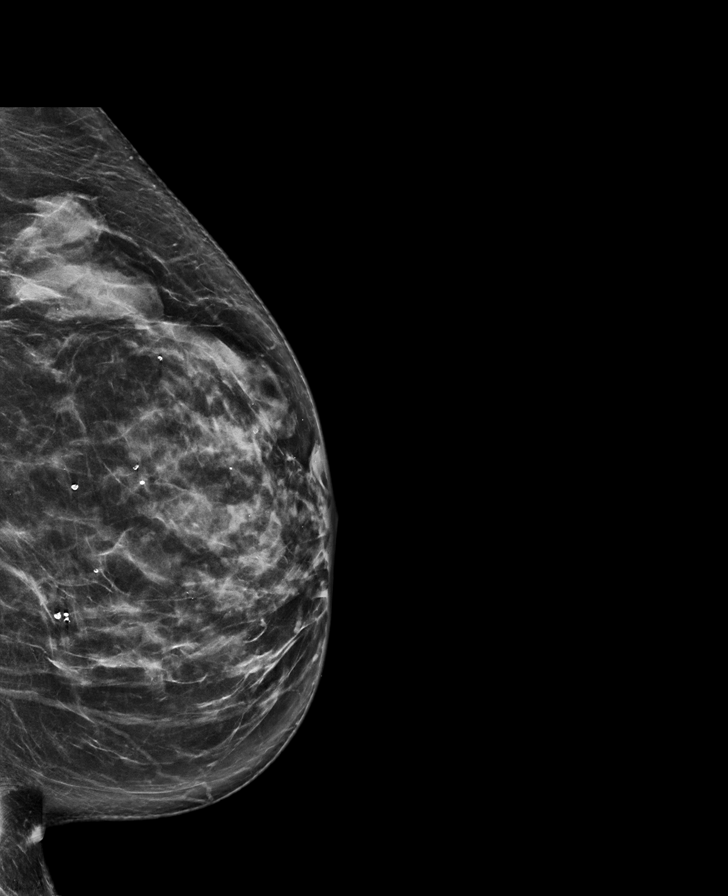

[L CC tomo · tomo slice 37/73.0]
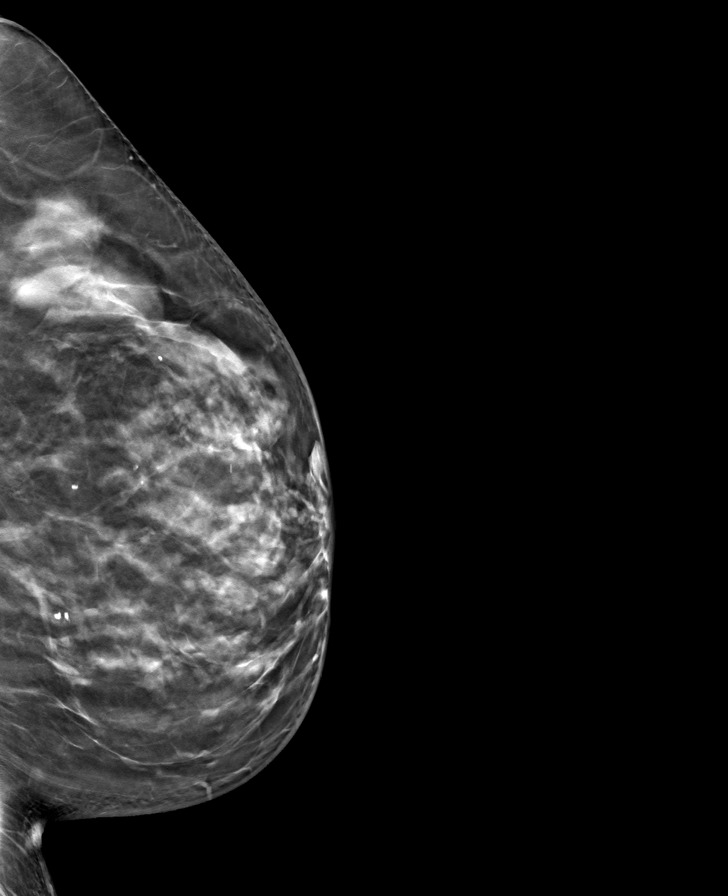

[L MLO tomo · tomo slice 42/83.0]
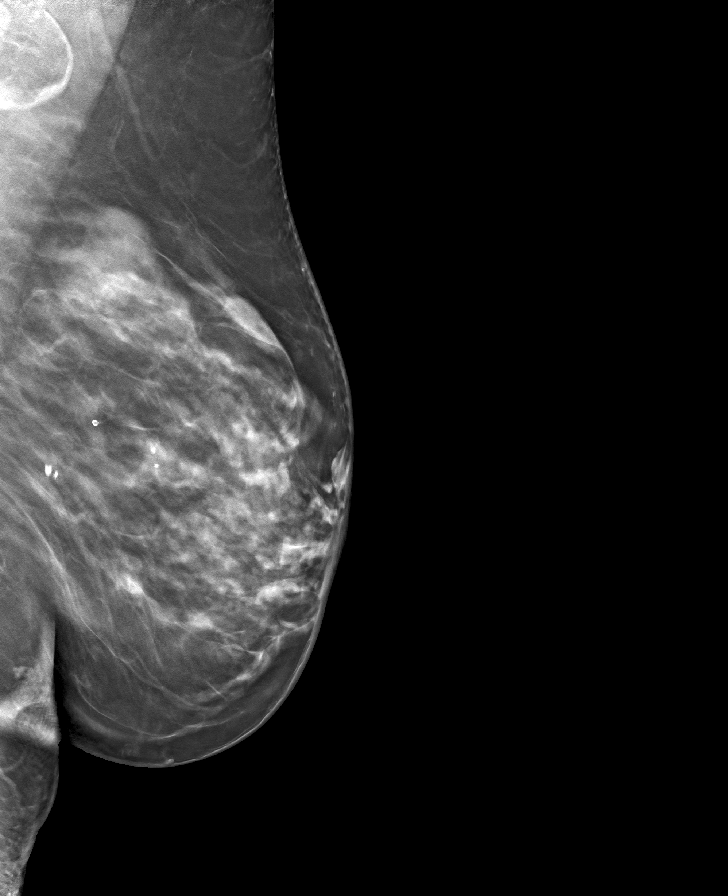

[R MLO tomo · tomo slice 45/89.0]
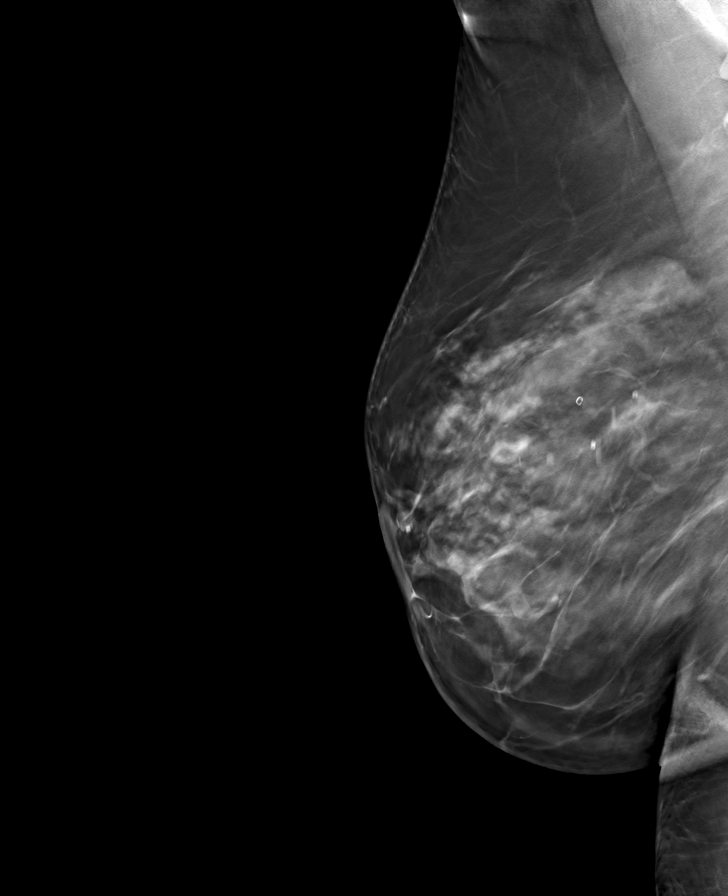

[R CC tomo · tomo slice 37/72.0]
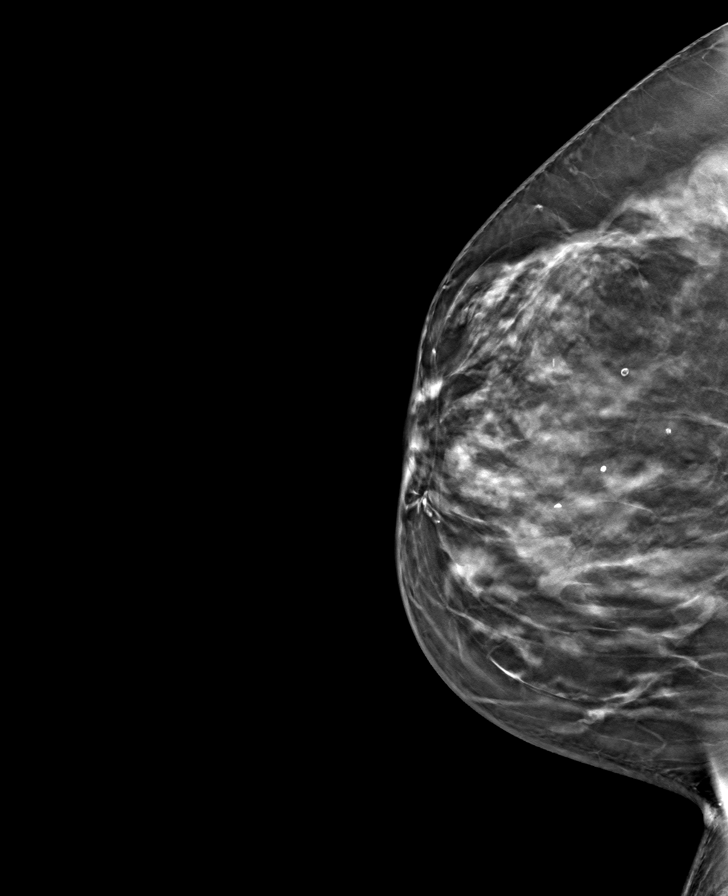

[8 of 24 positions shown; findings below may reference images not displayed]

ACR Breast Density Category c: The breast tissue is heterogeneously
dense, which may obscure small masses.
FINDINGS: There are no findings suspicious for malignancy. Images were
processed with CAD.
IMPRESSION: No mammographic evidence of malignancy. A result letter of this
screening mammogram will be mailed directly to the patient.

RECOMMENDATION:
Screening mammogram in one year. (Code:[id number]-U-LHB)

BI-RADS CATEGORY  1: Negative.

## 2021-03-24 DIAGNOSIS — E7849 Other hyperlipidemia: Secondary | ICD-10-CM | POA: Diagnosis not present

## 2021-03-24 DIAGNOSIS — E876 Hypokalemia: Secondary | ICD-10-CM | POA: Diagnosis not present

## 2021-03-24 DIAGNOSIS — I10 Essential (primary) hypertension: Secondary | ICD-10-CM | POA: Diagnosis not present

## 2021-03-24 DIAGNOSIS — E1169 Type 2 diabetes mellitus with other specified complication: Secondary | ICD-10-CM | POA: Diagnosis not present

## 2021-04-23 ENCOUNTER — Ambulatory Visit: Payer: Medicare Other

## 2021-04-24 DIAGNOSIS — E7849 Other hyperlipidemia: Secondary | ICD-10-CM | POA: Diagnosis not present

## 2021-04-24 DIAGNOSIS — I10 Essential (primary) hypertension: Secondary | ICD-10-CM | POA: Diagnosis not present

## 2021-04-24 DIAGNOSIS — E876 Hypokalemia: Secondary | ICD-10-CM | POA: Diagnosis not present

## 2021-04-24 DIAGNOSIS — E1169 Type 2 diabetes mellitus with other specified complication: Secondary | ICD-10-CM | POA: Diagnosis not present

## 2021-05-06 DIAGNOSIS — E876 Hypokalemia: Secondary | ICD-10-CM | POA: Diagnosis not present

## 2021-05-06 DIAGNOSIS — R739 Hyperglycemia, unspecified: Secondary | ICD-10-CM | POA: Diagnosis not present

## 2021-05-06 DIAGNOSIS — R7301 Impaired fasting glucose: Secondary | ICD-10-CM | POA: Diagnosis not present

## 2021-05-06 DIAGNOSIS — E782 Mixed hyperlipidemia: Secondary | ICD-10-CM | POA: Diagnosis not present

## 2021-05-06 DIAGNOSIS — E7849 Other hyperlipidemia: Secondary | ICD-10-CM | POA: Diagnosis not present

## 2021-05-11 DIAGNOSIS — E7849 Other hyperlipidemia: Secondary | ICD-10-CM | POA: Diagnosis not present

## 2021-05-11 DIAGNOSIS — E876 Hypokalemia: Secondary | ICD-10-CM | POA: Diagnosis not present

## 2021-05-11 DIAGNOSIS — J479 Bronchiectasis, uncomplicated: Secondary | ICD-10-CM | POA: Diagnosis not present

## 2021-05-11 DIAGNOSIS — E1169 Type 2 diabetes mellitus with other specified complication: Secondary | ICD-10-CM | POA: Diagnosis not present

## 2021-05-11 DIAGNOSIS — I10 Essential (primary) hypertension: Secondary | ICD-10-CM | POA: Diagnosis not present

## 2021-05-11 DIAGNOSIS — I700 Atherosclerosis of aorta: Secondary | ICD-10-CM | POA: Diagnosis not present

## 2021-05-11 DIAGNOSIS — Z0001 Encounter for general adult medical examination with abnormal findings: Secondary | ICD-10-CM | POA: Diagnosis not present

## 2021-05-24 DIAGNOSIS — E7849 Other hyperlipidemia: Secondary | ICD-10-CM | POA: Diagnosis not present

## 2021-05-24 DIAGNOSIS — I10 Essential (primary) hypertension: Secondary | ICD-10-CM | POA: Diagnosis not present

## 2021-05-24 DIAGNOSIS — E876 Hypokalemia: Secondary | ICD-10-CM | POA: Diagnosis not present

## 2021-05-24 DIAGNOSIS — E1169 Type 2 diabetes mellitus with other specified complication: Secondary | ICD-10-CM | POA: Diagnosis not present

## 2021-06-17 ENCOUNTER — Ambulatory Visit: Payer: Medicare Other

## 2021-06-23 DIAGNOSIS — L570 Actinic keratosis: Secondary | ICD-10-CM | POA: Diagnosis not present

## 2021-06-23 DIAGNOSIS — L728 Other follicular cysts of the skin and subcutaneous tissue: Secondary | ICD-10-CM | POA: Diagnosis not present

## 2021-06-24 DIAGNOSIS — E7849 Other hyperlipidemia: Secondary | ICD-10-CM | POA: Diagnosis not present

## 2021-06-24 DIAGNOSIS — E876 Hypokalemia: Secondary | ICD-10-CM | POA: Diagnosis not present

## 2021-06-24 DIAGNOSIS — E1169 Type 2 diabetes mellitus with other specified complication: Secondary | ICD-10-CM | POA: Diagnosis not present

## 2021-06-24 DIAGNOSIS — I10 Essential (primary) hypertension: Secondary | ICD-10-CM | POA: Diagnosis not present

## 2021-07-22 ENCOUNTER — Other Ambulatory Visit: Payer: Self-pay

## 2021-07-22 ENCOUNTER — Ambulatory Visit
Admission: RE | Admit: 2021-07-22 | Discharge: 2021-07-22 | Disposition: A | Discharge status: Home / Self Care | Payer: Medicare Other | Source: Ambulatory Visit | Attending: Family Medicine | Admitting: Family Medicine

## 2021-07-22 DIAGNOSIS — Z1231 Encounter for screening mammogram for malignant neoplasm of breast: Secondary | ICD-10-CM | POA: Diagnosis not present

## 2021-07-25 DIAGNOSIS — E876 Hypokalemia: Secondary | ICD-10-CM | POA: Diagnosis not present

## 2021-07-25 DIAGNOSIS — E7849 Other hyperlipidemia: Secondary | ICD-10-CM | POA: Diagnosis not present

## 2021-07-25 DIAGNOSIS — I10 Essential (primary) hypertension: Secondary | ICD-10-CM | POA: Diagnosis not present

## 2021-07-25 DIAGNOSIS — E1169 Type 2 diabetes mellitus with other specified complication: Secondary | ICD-10-CM | POA: Diagnosis not present

## 2021-07-26 DIAGNOSIS — R82991 Hypocitraturia: Secondary | ICD-10-CM | POA: Diagnosis not present

## 2021-07-26 DIAGNOSIS — N200 Calculus of kidney: Secondary | ICD-10-CM | POA: Diagnosis not present

## 2021-07-26 DIAGNOSIS — R82992 Hyperoxaluria: Secondary | ICD-10-CM | POA: Diagnosis not present

## 2021-09-06 DIAGNOSIS — L570 Actinic keratosis: Secondary | ICD-10-CM | POA: Diagnosis not present

## 2021-09-06 DIAGNOSIS — D239 Other benign neoplasm of skin, unspecified: Secondary | ICD-10-CM | POA: Diagnosis not present

## 2021-09-07 DIAGNOSIS — E1169 Type 2 diabetes mellitus with other specified complication: Secondary | ICD-10-CM | POA: Diagnosis not present

## 2021-09-07 DIAGNOSIS — E039 Hypothyroidism, unspecified: Secondary | ICD-10-CM | POA: Diagnosis not present

## 2021-09-07 DIAGNOSIS — Z1159 Encounter for screening for other viral diseases: Secondary | ICD-10-CM | POA: Diagnosis not present

## 2021-09-07 DIAGNOSIS — I10 Essential (primary) hypertension: Secondary | ICD-10-CM | POA: Diagnosis not present

## 2021-09-07 DIAGNOSIS — R7301 Impaired fasting glucose: Secondary | ICD-10-CM | POA: Diagnosis not present

## 2021-09-07 DIAGNOSIS — E782 Mixed hyperlipidemia: Secondary | ICD-10-CM | POA: Diagnosis not present

## 2021-09-07 DIAGNOSIS — E7849 Other hyperlipidemia: Secondary | ICD-10-CM | POA: Diagnosis not present

## 2021-09-09 DIAGNOSIS — E782 Mixed hyperlipidemia: Secondary | ICD-10-CM | POA: Diagnosis not present

## 2021-09-09 DIAGNOSIS — E876 Hypokalemia: Secondary | ICD-10-CM | POA: Diagnosis not present

## 2021-09-09 DIAGNOSIS — K7581 Nonalcoholic steatohepatitis (NASH): Secondary | ICD-10-CM | POA: Diagnosis not present

## 2021-09-09 DIAGNOSIS — M25511 Pain in right shoulder: Secondary | ICD-10-CM | POA: Diagnosis not present

## 2021-09-09 DIAGNOSIS — M19032 Primary osteoarthritis, left wrist: Secondary | ICD-10-CM | POA: Diagnosis not present

## 2021-09-09 DIAGNOSIS — I10 Essential (primary) hypertension: Secondary | ICD-10-CM | POA: Diagnosis not present

## 2021-09-09 DIAGNOSIS — Z23 Encounter for immunization: Secondary | ICD-10-CM | POA: Diagnosis not present

## 2021-09-09 DIAGNOSIS — E7849 Other hyperlipidemia: Secondary | ICD-10-CM | POA: Diagnosis not present

## 2021-10-25 DIAGNOSIS — E1169 Type 2 diabetes mellitus with other specified complication: Secondary | ICD-10-CM | POA: Diagnosis not present

## 2021-10-25 DIAGNOSIS — I10 Essential (primary) hypertension: Secondary | ICD-10-CM | POA: Diagnosis not present

## 2021-10-25 DIAGNOSIS — E876 Hypokalemia: Secondary | ICD-10-CM | POA: Diagnosis not present

## 2021-10-25 DIAGNOSIS — E7849 Other hyperlipidemia: Secondary | ICD-10-CM | POA: Diagnosis not present

## 2021-10-26 DIAGNOSIS — R059 Cough, unspecified: Secondary | ICD-10-CM | POA: Diagnosis not present

## 2021-10-26 DIAGNOSIS — K2100 Gastro-esophageal reflux disease with esophagitis, without bleeding: Secondary | ICD-10-CM | POA: Diagnosis not present

## 2021-11-02 DIAGNOSIS — H524 Presbyopia: Secondary | ICD-10-CM | POA: Diagnosis not present

## 2021-11-02 DIAGNOSIS — H40013 Open angle with borderline findings, low risk, bilateral: Secondary | ICD-10-CM | POA: Diagnosis not present

## 2021-12-21 DIAGNOSIS — M8589 Other specified disorders of bone density and structure, multiple sites: Secondary | ICD-10-CM | POA: Diagnosis not present

## 2021-12-21 DIAGNOSIS — M810 Age-related osteoporosis without current pathological fracture: Secondary | ICD-10-CM | POA: Diagnosis not present

## 2021-12-22 DIAGNOSIS — R059 Cough, unspecified: Secondary | ICD-10-CM | POA: Diagnosis not present

## 2021-12-22 DIAGNOSIS — R509 Fever, unspecified: Secondary | ICD-10-CM | POA: Diagnosis not present

## 2021-12-22 DIAGNOSIS — R110 Nausea: Secondary | ICD-10-CM | POA: Diagnosis not present

## 2022-01-10 DIAGNOSIS — K2100 Gastro-esophageal reflux disease with esophagitis, without bleeding: Secondary | ICD-10-CM | POA: Diagnosis not present

## 2022-01-10 DIAGNOSIS — I10 Essential (primary) hypertension: Secondary | ICD-10-CM | POA: Diagnosis not present

## 2022-01-10 DIAGNOSIS — M109 Gout, unspecified: Secondary | ICD-10-CM | POA: Diagnosis not present

## 2022-01-10 DIAGNOSIS — Z1329 Encounter for screening for other suspected endocrine disorder: Secondary | ICD-10-CM | POA: Diagnosis not present

## 2022-01-10 DIAGNOSIS — E1169 Type 2 diabetes mellitus with other specified complication: Secondary | ICD-10-CM | POA: Diagnosis not present

## 2022-01-10 DIAGNOSIS — E7849 Other hyperlipidemia: Secondary | ICD-10-CM | POA: Diagnosis not present

## 2022-01-10 DIAGNOSIS — R739 Hyperglycemia, unspecified: Secondary | ICD-10-CM | POA: Diagnosis not present

## 2022-01-12 DIAGNOSIS — I10 Essential (primary) hypertension: Secondary | ICD-10-CM | POA: Diagnosis not present

## 2022-01-12 DIAGNOSIS — M19032 Primary osteoarthritis, left wrist: Secondary | ICD-10-CM | POA: Diagnosis not present

## 2022-01-12 DIAGNOSIS — E876 Hypokalemia: Secondary | ICD-10-CM | POA: Diagnosis not present

## 2022-01-12 DIAGNOSIS — E7849 Other hyperlipidemia: Secondary | ICD-10-CM | POA: Diagnosis not present

## 2022-01-12 DIAGNOSIS — I700 Atherosclerosis of aorta: Secondary | ICD-10-CM | POA: Diagnosis not present

## 2022-01-12 DIAGNOSIS — J479 Bronchiectasis, uncomplicated: Secondary | ICD-10-CM | POA: Diagnosis not present

## 2022-01-12 DIAGNOSIS — E1169 Type 2 diabetes mellitus with other specified complication: Secondary | ICD-10-CM | POA: Diagnosis not present

## 2022-01-25 DIAGNOSIS — E7849 Other hyperlipidemia: Secondary | ICD-10-CM | POA: Diagnosis not present

## 2022-01-25 DIAGNOSIS — L820 Inflamed seborrheic keratosis: Secondary | ICD-10-CM | POA: Diagnosis not present

## 2022-01-25 DIAGNOSIS — H6123 Impacted cerumen, bilateral: Secondary | ICD-10-CM | POA: Diagnosis not present

## 2022-01-25 DIAGNOSIS — D485 Neoplasm of uncertain behavior of skin: Secondary | ICD-10-CM | POA: Diagnosis not present

## 2022-04-20 DIAGNOSIS — I10 Essential (primary) hypertension: Secondary | ICD-10-CM | POA: Diagnosis not present

## 2022-04-20 DIAGNOSIS — M79622 Pain in left upper arm: Secondary | ICD-10-CM | POA: Diagnosis not present

## 2022-04-21 DIAGNOSIS — D485 Neoplasm of uncertain behavior of skin: Secondary | ICD-10-CM | POA: Diagnosis not present

## 2022-04-21 DIAGNOSIS — B078 Other viral warts: Secondary | ICD-10-CM | POA: Diagnosis not present

## 2022-05-10 DIAGNOSIS — M25512 Pain in left shoulder: Secondary | ICD-10-CM | POA: Diagnosis not present

## 2022-05-24 DIAGNOSIS — J479 Bronchiectasis, uncomplicated: Secondary | ICD-10-CM | POA: Diagnosis not present

## 2022-05-24 DIAGNOSIS — E782 Mixed hyperlipidemia: Secondary | ICD-10-CM | POA: Diagnosis not present

## 2022-05-24 DIAGNOSIS — I10 Essential (primary) hypertension: Secondary | ICD-10-CM | POA: Diagnosis not present

## 2022-05-24 DIAGNOSIS — M19032 Primary osteoarthritis, left wrist: Secondary | ICD-10-CM | POA: Diagnosis not present

## 2022-05-24 DIAGNOSIS — Z23 Encounter for immunization: Secondary | ICD-10-CM | POA: Diagnosis not present

## 2022-05-24 DIAGNOSIS — I700 Atherosclerosis of aorta: Secondary | ICD-10-CM | POA: Diagnosis not present

## 2022-05-24 DIAGNOSIS — E1169 Type 2 diabetes mellitus with other specified complication: Secondary | ICD-10-CM | POA: Diagnosis not present

## 2022-05-24 DIAGNOSIS — E876 Hypokalemia: Secondary | ICD-10-CM | POA: Diagnosis not present

## 2022-05-24 DIAGNOSIS — Z0001 Encounter for general adult medical examination with abnormal findings: Secondary | ICD-10-CM | POA: Diagnosis not present

## 2022-08-01 DIAGNOSIS — Z1231 Encounter for screening mammogram for malignant neoplasm of breast: Secondary | ICD-10-CM | POA: Diagnosis not present

## 2022-08-11 DIAGNOSIS — M25512 Pain in left shoulder: Secondary | ICD-10-CM | POA: Diagnosis not present

## 2022-08-30 DIAGNOSIS — T560X1A Toxic effect of lead and its compounds, accidental (unintentional), initial encounter: Secondary | ICD-10-CM | POA: Diagnosis not present

## 2022-09-06 DIAGNOSIS — L570 Actinic keratosis: Secondary | ICD-10-CM | POA: Diagnosis not present

## 2022-09-06 DIAGNOSIS — D239 Other benign neoplasm of skin, unspecified: Secondary | ICD-10-CM | POA: Diagnosis not present

## 2022-09-14 DIAGNOSIS — N200 Calculus of kidney: Secondary | ICD-10-CM | POA: Diagnosis not present

## 2022-09-14 DIAGNOSIS — N281 Cyst of kidney, acquired: Secondary | ICD-10-CM | POA: Diagnosis not present

## 2022-09-15 DIAGNOSIS — E876 Hypokalemia: Secondary | ICD-10-CM | POA: Diagnosis not present

## 2022-09-15 DIAGNOSIS — R252 Cramp and spasm: Secondary | ICD-10-CM | POA: Diagnosis not present

## 2022-09-20 DIAGNOSIS — E7849 Other hyperlipidemia: Secondary | ICD-10-CM | POA: Diagnosis not present

## 2022-09-20 DIAGNOSIS — K2100 Gastro-esophageal reflux disease with esophagitis, without bleeding: Secondary | ICD-10-CM | POA: Diagnosis not present

## 2022-09-20 DIAGNOSIS — E1169 Type 2 diabetes mellitus with other specified complication: Secondary | ICD-10-CM | POA: Diagnosis not present

## 2022-09-20 DIAGNOSIS — K7581 Nonalcoholic steatohepatitis (NASH): Secondary | ICD-10-CM | POA: Diagnosis not present

## 2022-09-20 DIAGNOSIS — Z1329 Encounter for screening for other suspected endocrine disorder: Secondary | ICD-10-CM | POA: Diagnosis not present

## 2022-09-20 DIAGNOSIS — E782 Mixed hyperlipidemia: Secondary | ICD-10-CM | POA: Diagnosis not present

## 2022-09-20 DIAGNOSIS — R739 Hyperglycemia, unspecified: Secondary | ICD-10-CM | POA: Diagnosis not present

## 2022-09-23 DIAGNOSIS — Z23 Encounter for immunization: Secondary | ICD-10-CM | POA: Diagnosis not present

## 2022-09-23 DIAGNOSIS — J479 Bronchiectasis, uncomplicated: Secondary | ICD-10-CM | POA: Diagnosis not present

## 2022-09-23 DIAGNOSIS — M19032 Primary osteoarthritis, left wrist: Secondary | ICD-10-CM | POA: Diagnosis not present

## 2022-09-23 DIAGNOSIS — Z1389 Encounter for screening for other disorder: Secondary | ICD-10-CM | POA: Diagnosis not present

## 2022-09-23 DIAGNOSIS — I10 Essential (primary) hypertension: Secondary | ICD-10-CM | POA: Diagnosis not present

## 2022-09-23 DIAGNOSIS — E7849 Other hyperlipidemia: Secondary | ICD-10-CM | POA: Diagnosis not present

## 2022-09-23 DIAGNOSIS — E1169 Type 2 diabetes mellitus with other specified complication: Secondary | ICD-10-CM | POA: Diagnosis not present

## 2022-09-23 DIAGNOSIS — I700 Atherosclerosis of aorta: Secondary | ICD-10-CM | POA: Diagnosis not present

## 2022-09-23 DIAGNOSIS — K7581 Nonalcoholic steatohepatitis (NASH): Secondary | ICD-10-CM | POA: Diagnosis not present

## 2022-09-23 DIAGNOSIS — E876 Hypokalemia: Secondary | ICD-10-CM | POA: Diagnosis not present

## 2022-10-03 DIAGNOSIS — E876 Hypokalemia: Secondary | ICD-10-CM | POA: Diagnosis not present

## 2022-10-03 DIAGNOSIS — Z23 Encounter for immunization: Secondary | ICD-10-CM | POA: Diagnosis not present

## 2022-10-26 DIAGNOSIS — E876 Hypokalemia: Secondary | ICD-10-CM | POA: Diagnosis not present

## 2023-01-18 DIAGNOSIS — K2100 Gastro-esophageal reflux disease with esophagitis, without bleeding: Secondary | ICD-10-CM | POA: Diagnosis not present

## 2023-01-18 DIAGNOSIS — E1169 Type 2 diabetes mellitus with other specified complication: Secondary | ICD-10-CM | POA: Diagnosis not present

## 2023-01-18 DIAGNOSIS — E782 Mixed hyperlipidemia: Secondary | ICD-10-CM | POA: Diagnosis not present

## 2023-01-18 DIAGNOSIS — E7849 Other hyperlipidemia: Secondary | ICD-10-CM | POA: Diagnosis not present

## 2023-01-25 DIAGNOSIS — I700 Atherosclerosis of aorta: Secondary | ICD-10-CM | POA: Diagnosis not present

## 2023-01-25 DIAGNOSIS — J479 Bronchiectasis, uncomplicated: Secondary | ICD-10-CM | POA: Diagnosis not present

## 2023-01-25 DIAGNOSIS — E7849 Other hyperlipidemia: Secondary | ICD-10-CM | POA: Diagnosis not present

## 2023-01-25 DIAGNOSIS — M19032 Primary osteoarthritis, left wrist: Secondary | ICD-10-CM | POA: Diagnosis not present

## 2023-01-25 DIAGNOSIS — K7581 Nonalcoholic steatohepatitis (NASH): Secondary | ICD-10-CM | POA: Diagnosis not present

## 2023-01-25 DIAGNOSIS — K2100 Gastro-esophageal reflux disease with esophagitis, without bleeding: Secondary | ICD-10-CM | POA: Diagnosis not present

## 2023-01-25 DIAGNOSIS — E8881 Metabolic syndrome: Secondary | ICD-10-CM | POA: Diagnosis not present

## 2023-01-25 DIAGNOSIS — I10 Essential (primary) hypertension: Secondary | ICD-10-CM | POA: Diagnosis not present

## 2023-01-25 DIAGNOSIS — E876 Hypokalemia: Secondary | ICD-10-CM | POA: Diagnosis not present

## 2023-01-25 DIAGNOSIS — E1169 Type 2 diabetes mellitus with other specified complication: Secondary | ICD-10-CM | POA: Diagnosis not present

## 2023-02-01 DIAGNOSIS — M25511 Pain in right shoulder: Secondary | ICD-10-CM | POA: Diagnosis not present

## 2023-02-01 DIAGNOSIS — M25512 Pain in left shoulder: Secondary | ICD-10-CM | POA: Diagnosis not present

## 2023-02-01 DIAGNOSIS — M6281 Muscle weakness (generalized): Secondary | ICD-10-CM | POA: Diagnosis not present

## 2023-02-03 DIAGNOSIS — M25512 Pain in left shoulder: Secondary | ICD-10-CM | POA: Diagnosis not present

## 2023-02-03 DIAGNOSIS — M25511 Pain in right shoulder: Secondary | ICD-10-CM | POA: Diagnosis not present

## 2023-02-03 DIAGNOSIS — M6281 Muscle weakness (generalized): Secondary | ICD-10-CM | POA: Diagnosis not present

## 2023-02-06 DIAGNOSIS — M6281 Muscle weakness (generalized): Secondary | ICD-10-CM | POA: Diagnosis not present

## 2023-02-06 DIAGNOSIS — M25511 Pain in right shoulder: Secondary | ICD-10-CM | POA: Diagnosis not present

## 2023-02-06 DIAGNOSIS — M25512 Pain in left shoulder: Secondary | ICD-10-CM | POA: Diagnosis not present

## 2023-02-13 DIAGNOSIS — M6281 Muscle weakness (generalized): Secondary | ICD-10-CM | POA: Diagnosis not present

## 2023-02-13 DIAGNOSIS — M25512 Pain in left shoulder: Secondary | ICD-10-CM | POA: Diagnosis not present

## 2023-02-13 DIAGNOSIS — M25511 Pain in right shoulder: Secondary | ICD-10-CM | POA: Diagnosis not present

## 2023-02-15 DIAGNOSIS — M6281 Muscle weakness (generalized): Secondary | ICD-10-CM | POA: Diagnosis not present

## 2023-02-15 DIAGNOSIS — M25511 Pain in right shoulder: Secondary | ICD-10-CM | POA: Diagnosis not present

## 2023-02-15 DIAGNOSIS — M25512 Pain in left shoulder: Secondary | ICD-10-CM | POA: Diagnosis not present

## 2023-02-23 DIAGNOSIS — M25511 Pain in right shoulder: Secondary | ICD-10-CM | POA: Diagnosis not present

## 2023-02-23 DIAGNOSIS — M6281 Muscle weakness (generalized): Secondary | ICD-10-CM | POA: Diagnosis not present

## 2023-02-23 DIAGNOSIS — M25512 Pain in left shoulder: Secondary | ICD-10-CM | POA: Diagnosis not present

## 2023-02-27 DIAGNOSIS — M25512 Pain in left shoulder: Secondary | ICD-10-CM | POA: Diagnosis not present

## 2023-02-27 DIAGNOSIS — M6281 Muscle weakness (generalized): Secondary | ICD-10-CM | POA: Diagnosis not present

## 2023-02-27 DIAGNOSIS — M25511 Pain in right shoulder: Secondary | ICD-10-CM | POA: Diagnosis not present

## 2023-03-01 DIAGNOSIS — M6281 Muscle weakness (generalized): Secondary | ICD-10-CM | POA: Diagnosis not present

## 2023-03-01 DIAGNOSIS — M25511 Pain in right shoulder: Secondary | ICD-10-CM | POA: Diagnosis not present

## 2023-03-01 DIAGNOSIS — M25512 Pain in left shoulder: Secondary | ICD-10-CM | POA: Diagnosis not present

## 2023-03-03 DIAGNOSIS — M25512 Pain in left shoulder: Secondary | ICD-10-CM | POA: Diagnosis not present

## 2023-03-03 DIAGNOSIS — M25511 Pain in right shoulder: Secondary | ICD-10-CM | POA: Diagnosis not present

## 2023-03-06 DIAGNOSIS — M6281 Muscle weakness (generalized): Secondary | ICD-10-CM | POA: Diagnosis not present

## 2023-03-06 DIAGNOSIS — M25511 Pain in right shoulder: Secondary | ICD-10-CM | POA: Diagnosis not present

## 2023-03-06 DIAGNOSIS — M25512 Pain in left shoulder: Secondary | ICD-10-CM | POA: Diagnosis not present

## 2023-03-10 DIAGNOSIS — M25512 Pain in left shoulder: Secondary | ICD-10-CM | POA: Diagnosis not present

## 2023-03-10 DIAGNOSIS — M6281 Muscle weakness (generalized): Secondary | ICD-10-CM | POA: Diagnosis not present

## 2023-03-10 DIAGNOSIS — M25511 Pain in right shoulder: Secondary | ICD-10-CM | POA: Diagnosis not present

## 2023-03-13 DIAGNOSIS — M25511 Pain in right shoulder: Secondary | ICD-10-CM | POA: Diagnosis not present

## 2023-03-13 DIAGNOSIS — M6281 Muscle weakness (generalized): Secondary | ICD-10-CM | POA: Diagnosis not present

## 2023-03-13 DIAGNOSIS — M25512 Pain in left shoulder: Secondary | ICD-10-CM | POA: Diagnosis not present

## 2023-03-15 DIAGNOSIS — M25511 Pain in right shoulder: Secondary | ICD-10-CM | POA: Diagnosis not present

## 2023-03-15 DIAGNOSIS — M6281 Muscle weakness (generalized): Secondary | ICD-10-CM | POA: Diagnosis not present

## 2023-03-15 DIAGNOSIS — M25512 Pain in left shoulder: Secondary | ICD-10-CM | POA: Diagnosis not present

## 2023-03-20 DIAGNOSIS — M25512 Pain in left shoulder: Secondary | ICD-10-CM | POA: Diagnosis not present

## 2023-03-20 DIAGNOSIS — M25511 Pain in right shoulder: Secondary | ICD-10-CM | POA: Diagnosis not present

## 2023-03-20 DIAGNOSIS — M6281 Muscle weakness (generalized): Secondary | ICD-10-CM | POA: Diagnosis not present

## 2023-03-22 DIAGNOSIS — M25512 Pain in left shoulder: Secondary | ICD-10-CM | POA: Diagnosis not present

## 2023-03-22 DIAGNOSIS — M6281 Muscle weakness (generalized): Secondary | ICD-10-CM | POA: Diagnosis not present

## 2023-03-22 DIAGNOSIS — M25511 Pain in right shoulder: Secondary | ICD-10-CM | POA: Diagnosis not present

## 2023-04-25 DIAGNOSIS — H40013 Open angle with borderline findings, low risk, bilateral: Secondary | ICD-10-CM | POA: Diagnosis not present

## 2023-06-19 DIAGNOSIS — K7581 Nonalcoholic steatohepatitis (NASH): Secondary | ICD-10-CM | POA: Diagnosis not present

## 2023-06-19 DIAGNOSIS — E7849 Other hyperlipidemia: Secondary | ICD-10-CM | POA: Diagnosis not present

## 2023-06-19 DIAGNOSIS — I10 Essential (primary) hypertension: Secondary | ICD-10-CM | POA: Diagnosis not present

## 2023-06-19 DIAGNOSIS — E1169 Type 2 diabetes mellitus with other specified complication: Secondary | ICD-10-CM | POA: Diagnosis not present

## 2023-06-19 DIAGNOSIS — E559 Vitamin D deficiency, unspecified: Secondary | ICD-10-CM | POA: Diagnosis not present

## 2023-06-19 DIAGNOSIS — E039 Hypothyroidism, unspecified: Secondary | ICD-10-CM | POA: Diagnosis not present

## 2023-06-22 DIAGNOSIS — E7849 Other hyperlipidemia: Secondary | ICD-10-CM | POA: Diagnosis not present

## 2023-06-22 DIAGNOSIS — E876 Hypokalemia: Secondary | ICD-10-CM | POA: Diagnosis not present

## 2023-06-22 DIAGNOSIS — I10 Essential (primary) hypertension: Secondary | ICD-10-CM | POA: Diagnosis not present

## 2023-06-22 DIAGNOSIS — K2100 Gastro-esophageal reflux disease with esophagitis, without bleeding: Secondary | ICD-10-CM | POA: Diagnosis not present

## 2023-06-22 DIAGNOSIS — J479 Bronchiectasis, uncomplicated: Secondary | ICD-10-CM | POA: Diagnosis not present

## 2023-06-22 DIAGNOSIS — I700 Atherosclerosis of aorta: Secondary | ICD-10-CM | POA: Diagnosis not present

## 2023-06-22 DIAGNOSIS — M19032 Primary osteoarthritis, left wrist: Secondary | ICD-10-CM | POA: Diagnosis not present

## 2023-06-22 DIAGNOSIS — Z23 Encounter for immunization: Secondary | ICD-10-CM | POA: Diagnosis not present

## 2023-06-22 DIAGNOSIS — Z0001 Encounter for general adult medical examination with abnormal findings: Secondary | ICD-10-CM | POA: Diagnosis not present

## 2023-06-22 DIAGNOSIS — E1169 Type 2 diabetes mellitus with other specified complication: Secondary | ICD-10-CM | POA: Diagnosis not present

## 2023-06-22 DIAGNOSIS — K7581 Nonalcoholic steatohepatitis (NASH): Secondary | ICD-10-CM | POA: Diagnosis not present

## 2023-06-22 DIAGNOSIS — E8881 Metabolic syndrome: Secondary | ICD-10-CM | POA: Diagnosis not present

## 2023-07-31 DIAGNOSIS — M25512 Pain in left shoulder: Secondary | ICD-10-CM | POA: Diagnosis not present

## 2023-07-31 DIAGNOSIS — M25511 Pain in right shoulder: Secondary | ICD-10-CM | POA: Diagnosis not present

## 2023-07-31 DIAGNOSIS — G8929 Other chronic pain: Secondary | ICD-10-CM | POA: Diagnosis not present

## 2023-09-06 DIAGNOSIS — D485 Neoplasm of uncertain behavior of skin: Secondary | ICD-10-CM | POA: Diagnosis not present

## 2023-09-06 DIAGNOSIS — L570 Actinic keratosis: Secondary | ICD-10-CM | POA: Diagnosis not present

## 2023-09-07 DIAGNOSIS — K5792 Diverticulitis of intestine, part unspecified, without perforation or abscess without bleeding: Secondary | ICD-10-CM | POA: Diagnosis not present

## 2023-09-07 DIAGNOSIS — R300 Dysuria: Secondary | ICD-10-CM | POA: Diagnosis not present

## 2023-09-18 DIAGNOSIS — R059 Cough, unspecified: Secondary | ICD-10-CM | POA: Diagnosis not present

## 2023-09-18 DIAGNOSIS — J029 Acute pharyngitis, unspecified: Secondary | ICD-10-CM | POA: Diagnosis not present

## 2023-09-27 DIAGNOSIS — N281 Cyst of kidney, acquired: Secondary | ICD-10-CM | POA: Diagnosis not present

## 2023-09-27 DIAGNOSIS — Z87442 Personal history of urinary calculi: Secondary | ICD-10-CM | POA: Diagnosis not present

## 2023-09-28 DIAGNOSIS — N200 Calculus of kidney: Secondary | ICD-10-CM | POA: Diagnosis not present

## 2023-09-28 DIAGNOSIS — R1032 Left lower quadrant pain: Secondary | ICD-10-CM | POA: Diagnosis not present

## 2023-09-28 DIAGNOSIS — M545 Low back pain, unspecified: Secondary | ICD-10-CM | POA: Diagnosis not present

## 2023-09-28 DIAGNOSIS — K5730 Diverticulosis of large intestine without perforation or abscess without bleeding: Secondary | ICD-10-CM | POA: Diagnosis not present

## 2023-09-28 DIAGNOSIS — N132 Hydronephrosis with renal and ureteral calculous obstruction: Secondary | ICD-10-CM | POA: Diagnosis not present

## 2023-09-30 ENCOUNTER — Emergency Department (HOSPITAL_COMMUNITY): Payer: Medicare Other

## 2023-09-30 ENCOUNTER — Encounter (HOSPITAL_COMMUNITY): Payer: Self-pay | Admitting: Emergency Medicine

## 2023-09-30 ENCOUNTER — Other Ambulatory Visit: Payer: Self-pay

## 2023-09-30 ENCOUNTER — Emergency Department (HOSPITAL_COMMUNITY)
Admission: EM | Admit: 2023-09-30 | Discharge: 2023-09-30 | Disposition: A | Discharge status: Home / Self Care | Payer: Medicare Other | Attending: Emergency Medicine | Admitting: Emergency Medicine

## 2023-09-30 DIAGNOSIS — N200 Calculus of kidney: Secondary | ICD-10-CM | POA: Diagnosis not present

## 2023-09-30 DIAGNOSIS — N132 Hydronephrosis with renal and ureteral calculous obstruction: Secondary | ICD-10-CM | POA: Diagnosis not present

## 2023-09-30 DIAGNOSIS — R109 Unspecified abdominal pain: Secondary | ICD-10-CM | POA: Diagnosis not present

## 2023-09-30 DIAGNOSIS — K449 Diaphragmatic hernia without obstruction or gangrene: Secondary | ICD-10-CM | POA: Diagnosis not present

## 2023-09-30 DIAGNOSIS — K5730 Diverticulosis of large intestine without perforation or abscess without bleeding: Secondary | ICD-10-CM | POA: Diagnosis not present

## 2023-09-30 DIAGNOSIS — R112 Nausea with vomiting, unspecified: Secondary | ICD-10-CM | POA: Diagnosis present

## 2023-09-30 LAB — CBC WITH DIFFERENTIAL/PLATELET
Abs Immature Granulocytes: 0.04 10*3/uL (ref 0.00–0.07)
Basophils Absolute: 0.1 10*3/uL (ref 0.0–0.1)
Basophils Relative: 1 %
Eosinophils Absolute: 0.2 10*3/uL (ref 0.0–0.5)
Eosinophils Relative: 2 %
HCT: 39.9 % (ref 36.0–46.0)
Hemoglobin: 13.3 g/dL (ref 12.0–15.0)
Immature Granulocytes: 0 %
Lymphocytes Relative: 22 %
Lymphs Abs: 2.2 10*3/uL (ref 0.7–4.0)
MCH: 30.5 pg (ref 26.0–34.0)
MCHC: 33.3 g/dL (ref 30.0–36.0)
MCV: 91.5 fL (ref 80.0–100.0)
Monocytes Absolute: 0.7 10*3/uL (ref 0.1–1.0)
Monocytes Relative: 7 %
Neutro Abs: 6.5 10*3/uL (ref 1.7–7.7)
Neutrophils Relative %: 68 %
Platelets: 198 10*3/uL (ref 150–400)
RBC: 4.36 MIL/uL (ref 3.87–5.11)
RDW: 14.2 % (ref 11.5–15.5)
WBC: 9.7 10*3/uL (ref 4.0–10.5)
nRBC: 0 % (ref 0.0–0.2)

## 2023-09-30 LAB — COMPREHENSIVE METABOLIC PANEL
ALT: 19 U/L (ref 0–44)
AST: 24 U/L (ref 15–41)
Albumin: 4 g/dL (ref 3.5–5.0)
Alkaline Phosphatase: 72 U/L (ref 38–126)
Anion gap: 11 (ref 5–15)
BUN: 12 mg/dL (ref 8–23)
CO2: 21 mmol/L — ABNORMAL LOW (ref 22–32)
Calcium: 9.3 mg/dL (ref 8.9–10.3)
Chloride: 102 mmol/L (ref 98–111)
Creatinine, Ser: 0.98 mg/dL (ref 0.44–1.00)
GFR, Estimated: >60 mL/min (ref >60)
Glucose, Bld: 154 mg/dL — ABNORMAL HIGH (ref 70–99)
Potassium: 3.7 mmol/L (ref 3.5–5.1)
Sodium: 134 mmol/L — ABNORMAL LOW (ref 135–145)
Total Bilirubin: 2.1 mg/dL — ABNORMAL HIGH (ref 0.3–1.2)
Total Protein: 7.1 g/dL (ref 6.5–8.1)

## 2023-09-30 LAB — URINALYSIS, ROUTINE W REFLEX MICROSCOPIC
Bacteria, UA: NONE SEEN
Bilirubin Urine: NEGATIVE
Glucose, UA: NEGATIVE mg/dL
Ketones, ur: NEGATIVE mg/dL
Nitrite: NEGATIVE
Protein, ur: NEGATIVE mg/dL
Specific Gravity, Urine: 1.013 (ref 1.005–1.030)
pH: 5 (ref 5.0–8.0)

## 2023-09-30 LAB — LIPASE, BLOOD: Lipase: 31 U/L (ref 11–51)

## 2023-09-30 MED ORDER — IOHEXOL 300 MG/ML  SOLN
100.0000 mL | Freq: Once | INTRAMUSCULAR | Status: AC | PRN
  Administered 2023-09-30: 100 mL via INTRAVENOUS

## 2023-09-30 MED ORDER — PROMETHAZINE HCL 25 MG/ML IJ SOLN
INTRAMUSCULAR | Status: AC
  Filled 2023-09-30: qty 1

## 2023-09-30 MED ORDER — TAMSULOSIN HCL 0.4 MG PO CAPS: 0.4000 mg | ORAL_CAPSULE | Freq: Every day | ORAL | 0 refills | Status: AC

## 2023-09-30 MED ORDER — LACTATED RINGERS IV BOLUS
1000.0000 mL | Freq: Once | INTRAVENOUS | Status: AC
  Administered 2023-09-30: 1000 mL via INTRAVENOUS

## 2023-09-30 MED ORDER — OXYCODONE-ACETAMINOPHEN 5-325 MG PO TABS: 1.0000 | ORAL_TABLET | Freq: Four times a day (QID) | ORAL | 0 refills | Status: AC | PRN

## 2023-09-30 MED ORDER — SODIUM CHLORIDE 0.9 % IV SOLN
12.5000 mg | Freq: Four times a day (QID) | INTRAVENOUS | Status: DC | PRN
  Administered 2023-09-30: 12.5 mg via INTRAVENOUS
  Filled 2023-09-30: qty 0.5

## 2023-09-30 MED ORDER — KETOROLAC TROMETHAMINE 30 MG/ML IJ SOLN
15.0000 mg | Freq: Once | INTRAMUSCULAR | Status: AC
  Administered 2023-09-30: 15 mg via INTRAVENOUS
  Filled 2023-09-30: qty 1

## 2023-09-30 MED ORDER — ONDANSETRON 4 MG PO TBDP: ORAL_TABLET | ORAL | 0 refills | Status: AC

## 2023-09-30 MED ORDER — ONDANSETRON HCL 4 MG/2ML IJ SOLN
INTRAMUSCULAR | Status: AC
  Filled 2023-09-30: qty 2

## 2023-09-30 MED ORDER — HYDROMORPHONE HCL 1 MG/ML IJ SOLN
1.0000 mg | Freq: Once | INTRAMUSCULAR | Status: AC
  Administered 2023-09-30: 1 mg via INTRAVENOUS
  Filled 2023-09-30: qty 1

## 2023-09-30 MED ORDER — ONDANSETRON HCL 4 MG/2ML IJ SOLN
4.0000 mg | Freq: Once | INTRAMUSCULAR | Status: AC
  Administered 2023-09-30: 4 mg via INTRAVENOUS

## 2023-09-30 NOTE — Discharge Instructions (Signed)
Follow-up with your urologist next week.  If you have problems over the weekend you should go to [facility] and be seen at [facility] because that is where the urologists are on the weekend

## 2023-09-30 NOTE — ED Triage Notes (Signed)
Pt states she was seen by PCP ( [facility]) on Thursday with c/o L flank pain. Pt states she had a [place] and was told that she had a kidney stone and sent home with a prescription of Lortab and Zofran and told to "drink and walk a lot" which pt stated she did. Reports worsening flank pain since then.

## 2023-09-30 NOTE — ED Provider Notes (Signed)
[place] EMERGENCY DEPARTMENT AT [facility] Provider Note   CSN: [id number] Arrival date & time: 09/30/23  0456     History  Chief Complaint  Patient presents with   Nephrolithiasis    Karen Tucker is a 73 y.o. female.  72 year old female with a history of nephrolithiasis multiple times in the past requiring intervention presents ER today with pain similar to previous kidney stones.  Patient states that she had a normal follow-up ultrasound this week which was normal but then started having some pain on Wednesday similar to kidney stones that she follow-up with her primary doctor who sent her for another ultrasound that showed a 3 mm stone in the ureter somewhere.  She is not sure where.  She I do not have access to that ultrasound over the report in epic.  She states they started her on this medications however she has had severe pain, nausea and vomiting tonight.  Little bit of dysuria but no hematuria.  No fevers.  No abdominal pain.  Pain is in her left flank.  No trauma.  No other associated symptoms.        Home Medications Prior to Admission medications   Medication Sig Start Date End Date Taking? Authorizing Provider  acetaminophen (TYLENOL) 500 MG tablet Take 500 mg by mouth 2 (two) times daily as needed for moderate pain.    [provider]  allopurinol (ZYLOPRIM) 300 MG tablet Take 300 mg by mouth daily.    [provider]  HYDROmorphone (DILAUDID) 2 MG tablet Take 1-2 tablets (2-4 mg total) by mouth every 4 (four) hours as needed for severe pain. Patient not taking: Reported on 07/05/2019 05/09/17   Julien Girt, Alexzandrew L, PA-C  losartan (COZAAR) 100 MG tablet Take 100 mg by mouth daily.    [provider]  methocarbamol (ROBAXIN) 500 MG tablet Take 1 tablet (500 mg total) by mouth every 6 (six) hours as needed for muscle spasms. Patient not taking: Reported on 07/05/2019 05/09/17   Julien Girt, Alexzandrew L, PA-C  Naproxen Sodium  (ALEVE PO) Take by mouth as needed.    [provider]  ondansetron (ZOFRAN) 4 MG tablet Take 1 tablet (4 mg total) by mouth every 6 (six) hours as needed for nausea. Patient not taking: Reported on 08/06/2019 05/09/17   Julien Girt, Alexzandrew L, PA-C  potassium chloride (K-DUR) 10 MEQ tablet Take 50 mEq by mouth daily. Takes 5 tablets daily    [provider]  rivaroxaban (XARELTO) 10 MG TABS tablet Take 1 tablet (10 mg total) by mouth daily with breakfast. Take Xarelto for two and a half more weeks following discharge from the hospital, then discontinue Xarelto. Once the patient has completed the blood thinner regimen, then take a Baby 81 mg Aspirin daily for three more weeks. Patient not taking: Reported on 07/05/2019 05/10/17   Julien Girt, Alexzandrew L, PA-C  traMADol (ULTRAM) 50 MG tablet Take 1-2 tablets (50-100 mg total) by mouth every 6 (six) hours as needed for moderate pain. Patient not taking: Reported on 07/05/2019 05/09/17   Julien Girt, Alexzandrew L, PA-C      Allergies    Codeine, Lisinopril, and Morphine and codeine    Review of Systems   Review of Systems  Physical Exam Updated Vital Signs BP (!) 144/76 (BP Location: Left Arm)   Pulse 74   Temp (!) 97.5 F (36.4 C) (Oral)   Resp (!) 22   Ht 5\' 2"  (1.575 m)   Wt 78.5 kg  SpO2 99%   BMI 31.64 kg/m  Physical Exam Vitals and nursing note reviewed.  Constitutional:      Appearance: She is well-developed.  HENT:     Head: Normocephalic and atraumatic.  Eyes:     Pupils: Pupils are equal, round, and reactive to light.  Cardiovascular:     Rate and Rhythm: Normal rate and regular rhythm.  Pulmonary:     Effort: No respiratory distress.     Breath sounds: No stridor.  Abdominal:     General: There is no distension.  Musculoskeletal:        General: No swelling or tenderness. Normal range of motion.     Cervical back: Normal range of motion.  Skin:    General: Skin is warm and dry.  Neurological:      General: No focal deficit present.     Mental Status: She is alert.     ED Results / Procedures / Treatments   Labs (all labs ordered are listed, but only abnormal results are displayed) Labs Reviewed  COMPREHENSIVE METABOLIC PANEL - Abnormal; Notable for the following components:      Result Value   Sodium 134 (*)    CO2 21 (*)    Glucose, Bld 154 (*)    Total Bilirubin 2.1 (*)    All other components within normal limits  CBC WITH DIFFERENTIAL/PLATELET  LIPASE, BLOOD  URINALYSIS, ROUTINE W REFLEX MICROSCOPIC    EKG None  Radiology No results found.  Procedures Procedures    Medications Ordered in ED Medications  promethazine (PHENERGAN) 12.5 mg in sodium chloride 0.9 % 50 mL IVPB (0 mg Intravenous Stopped 09/30/23 0642)  HYDROmorphone (DILAUDID) injection 1 mg (1 mg Intravenous Given 09/30/23 0554)  lactated ringers bolus 1,000 mL (1,000 mLs Intravenous New Bag/Given 09/30/23 0554)  ketorolac (TORADOL) 30 MG/ML injection 15 mg (15 mg Intravenous Given 09/30/23 0554)  lactated ringers bolus 1,000 mL (1,000 mLs Intravenous New Bag/Given 09/30/23 9562)    ED Course/ Medical Decision Making/ A&P                                 Medical Decision Making Amount and/or Complexity of Data Reviewed Labs: ordered. Radiology: ordered.  Risk Prescription drug management.  72 year old female with likely nephrolithiasis as it was diagnosed earlier in the week with uncontrolled pain and some vomiting.  Will hold on CT scan for now unless pain persists, no urination, possibility of infection or other indications.  Will treat symptoms in the meantime.  Patient still not making any urine we will give another liter of fluids.  Consider possible CT scan at this point however kidney function is fine so okay at this time.  Will wait to see if she makes urine if not may need a CT scan to ensure that she does not have a distal obstruction.  Pain and nausea controlled currently.     Care  transferred to Dr. Estell Harpin pending completion of workup, reevaluation for disposition.  Suspect patient likely will go home if everything is reassuring and she is still improved symptoms.  May need oxycodone rather than hydrocodone that she has though.   Final Clinical Impression(s) / ED Diagnoses Final diagnoses:  None    Rx / DC Orders ED Discharge Orders     None         Patrese Neal, Barbara Cower, MD 09/30/23 [phone]

## 2023-10-04 DIAGNOSIS — R110 Nausea: Secondary | ICD-10-CM | POA: Diagnosis not present

## 2023-10-04 DIAGNOSIS — N202 Calculus of kidney with calculus of ureter: Secondary | ICD-10-CM | POA: Diagnosis not present

## 2023-10-06 ENCOUNTER — Other Ambulatory Visit: Payer: Self-pay | Admitting: Urology

## 2023-10-10 ENCOUNTER — Inpatient Hospital Stay (HOSPITAL_COMMUNITY): Admission: RE | Admit: 2023-10-10 | Payer: Medicare Other | Source: Ambulatory Visit

## 2023-10-10 ENCOUNTER — Encounter (HOSPITAL_COMMUNITY): Payer: Self-pay

## 2023-10-10 NOTE — Patient Instructions (Signed)
SURGICAL WAITING ROOM VISITATION Patients having surgery or a procedure may have no more than 2 support people in the waiting area - these visitors may rotate.    Children under the age of 79 must have an adult with them who is not the patient.  If the patient needs to stay at the hospital during part of their recovery, the visitor guidelines for inpatient rooms apply. Pre-op nurse will coordinate an appropriate time for 1 support person to accompany patient in pre-op.  This support person may not rotate.    Please refer to the [facility] website for the visitor guidelines for Inpatients (after your surgery is over and you are in a regular room).       Your procedure is scheduled on: 10-17-23   Report to [facility] Main Entrance    Report to admitting at 9:45 AM   Call this number if you have problems the morning of surgery [phone]   Do not eat food or drink liquids:After Midnight.           If you have questions, please contact your surgeon's office.   FOLLOW  ANY ADDITIONAL PRE OP INSTRUCTIONS YOU RECEIVED FROM YOUR SURGEON'S OFFICE!!!     Oral Hygiene is also important to reduce your risk of infection.                                    Remember - BRUSH YOUR TEETH THE MORNING OF SURGERY WITH YOUR REGULAR TOOTHPASTE   Do NOT smoke after Midnight   Take these medicines the morning of surgery with A SIP OF WATER:   Tamsulosin  If needed Tylenol or Hydrocodone  Stop all vitamins and herbal supplements 7 days before surgery  Bring CPAP mask and tubing day of surgery.                              You may not have any metal on your body including hair pins, jewelry, and body piercing             Do not wear make-up, lotions, powders, perfumes or deodorant  Do not wear nail polish including gel and S&S, artificial/acrylic nails, or any other type of covering on natural nails including finger and toenails. If you have artificial nails, gel coating, etc. that  needs to be removed by a nail salon please have this removed prior to surgery or surgery may need to be canceled/ delayed if the surgeon/ anesthesia feels like they are unable to be safely monitored.   Do not shave  48 hours prior to surgery.    Do not bring valuables to the hospital. [facility] IS NOT RESPONSIBLE   FOR VALUABLES.   Contacts, dentures or bridgework may not be worn into surgery.  DO NOT BRING YOUR HOME MEDICATIONS TO THE HOSPITAL. PHARMACY WILL DISPENSE MEDICATIONS LISTED ON YOUR MEDICATION LIST TO YOU DURING YOUR ADMISSION IN THE HOSPITAL!    Patients discharged on the day of surgery will not be allowed to drive home.  Someone NEEDS to stay with you for the first 24 hours after anesthesia.   Special Instructions: Bring a copy of your healthcare power of attorney and living will documents the day of surgery if you haven't scanned them before.              Please read over  the following fact sheets you were given: IF YOU HAVE QUESTIONS ABOUT YOUR PRE-OP INSTRUCTIONS PLEASE CALL [phone] Rosey Bath  If you received a COVID test during your pre-op visit  it is requested that you wear a mask when out in public, stay away from anyone that may not be feeling well and notify your surgeon if you develop symptoms. If you test positive for Covid or have been in contact with anyone that has tested positive in the last 10 days please notify you surgeon.  [facility] - Preparing for Surgery Before surgery, you can play an important role.  Because skin is not sterile, your skin needs to be as free of germs as possible.  You can reduce the number of germs on your skin by washing with CHG (chlorahexidine gluconate) soap before surgery.  CHG is an antiseptic cleaner which kills germs and bonds with the skin to continue killing germs even after washing. Please DO NOT use if you have an allergy to CHG or antibacterial soaps.  If your skin becomes reddened/irritated stop using the CHG and inform  your nurse when you arrive at Short Stay. Do not shave (including legs and underarms) for at least 48 hours prior to the first CHG shower.  You may shave your face/neck.  Please follow these instructions carefully:  1.  Shower with CHG Soap the night before surgery and the  morning of surgery.  2.  If you choose to wash your hair, wash your hair first as usual with your normal  shampoo.  3.  After you shampoo, rinse your hair and body thoroughly to remove the shampoo.                             4.  Use CHG as you would any other liquid soap.  You can apply chg directly to the skin and wash.  Gently with a scrungie or clean washcloth.  5.  Apply the CHG Soap to your body ONLY FROM THE NECK DOWN.   Do   not use on face/ open                           Wound or open sores. Avoid contact with eyes, ears mouth and   genitals (private parts).                       Wash face,  Genitals (private parts) with your normal soap.             6.  Wash thoroughly, paying special attention to the area where your    surgery  will be performed.  7.  Thoroughly rinse your body with warm water from the neck down.  8.  DO NOT shower/wash with your normal soap after using and rinsing off the CHG Soap.                9.  Pat yourself dry with a clean towel.            10.  Wear clean pajamas.            11.  Place clean sheets on your bed the night of your first shower and do not  sleep with pets. Day of Surgery : Do not apply any lotions/deodorants the morning of surgery.  Please wear clean clothes to the hospital/surgery center.  FAILURE TO FOLLOW THESE INSTRUCTIONS  MAY RESULT IN THE CANCELLATION OF YOUR SURGERY  PATIENT SIGNATURE_________________________________  NURSE SIGNATURE__________________________________  ________________________________________________________________________

## 2023-10-12 ENCOUNTER — Encounter (HOSPITAL_COMMUNITY): Payer: Self-pay

## 2023-10-12 ENCOUNTER — Other Ambulatory Visit: Payer: Self-pay

## 2023-10-12 ENCOUNTER — Encounter (HOSPITAL_COMMUNITY)
Admission: RE | Admit: 2023-10-12 | Discharge: 2023-10-12 | Disposition: A | Discharge status: Home / Self Care | Payer: Medicare Other | Source: Ambulatory Visit | Attending: Urology | Admitting: Urology

## 2023-10-12 VITALS — BP 140/89 | HR 85 | Temp 97.5°F | Resp 16 | Ht 62.0 in | Wt 171.0 lb

## 2023-10-12 DIAGNOSIS — R9431 Abnormal electrocardiogram [ECG] [EKG]: Secondary | ICD-10-CM | POA: Diagnosis not present

## 2023-10-12 DIAGNOSIS — Z01818 Encounter for other preprocedural examination: Secondary | ICD-10-CM | POA: Insufficient documentation

## 2023-10-12 DIAGNOSIS — I10 Essential (primary) hypertension: Secondary | ICD-10-CM | POA: Insufficient documentation

## 2023-10-12 DIAGNOSIS — Z01810 Encounter for preprocedural cardiovascular examination: Secondary | ICD-10-CM | POA: Diagnosis present

## 2023-10-12 DIAGNOSIS — Z01812 Encounter for preprocedural laboratory examination: Secondary | ICD-10-CM | POA: Diagnosis present

## 2023-10-12 HISTORY — DX: Personal history of urinary calculi: Z87.442

## 2023-10-12 HISTORY — DX: Anemia, unspecified: D64.9

## 2023-10-12 HISTORY — DX: Unspecified osteoarthritis, unspecified site: M19.90

## 2023-10-12 NOTE — Progress Notes (Addendum)
COVID Vaccine received:  []  No [x]  Yes Date of any COVID positive Test in last 90 days: Had 1 month ago PCP - Donzetta Sprung MD Cardiologist - no  Chest x-ray - no EKG -   Stress Test -  ECHO -  Cardiac Cath -   Bowel Prep - [x]  No  []   Yes ______  Pacemaker / ICD device [x]  No []  Yes   Spinal Cord Stimulator:[x]  No []  Yes       History of Sleep Apnea? [x]  No []  Yes   CPAP used?- [x]  No []  Yes    Does the patient monitor blood sugar?          [x]  No []  Yes  []  N/A  Patient has: [x]  NO Hx DM   []  Pre-DM                 []  DM1  []   DM2 Does patient have a [employer] or [employer]? []  No []  Yes   Fasting Blood Sugar Ranges-  Checks Blood Sugar _____ times a day  GLP1 agonist / usual dose - no GLP1 instructions:  SGLT-2 inhibitors / usual dose - no SGLT-2 instructions:   Blood Thinner / Instructions:no Aspirin Instructions:no  Comments:   Activity level: Patient is able to climb a flight of stairs without difficulty; [x]  No CP  [x]  No SOB   Patient can  perform ADLs without assistance.   Anesthesia review: HTN, L anterior fascicular block .......  Patient denies shortness of breath, fever, cough and chest pain at PAT appointment.  Patient verbalized understanding and agreement to the Pre-Surgical Instructions that were given to them at this PAT appointment. Patient was also educated of the need to review these PAT instructions again prior to his/her surgery.I reviewed the appropriate phone numbers to call if they have any and questions or concerns.

## 2023-10-13 NOTE — Plan of Care (Signed)
CHL Tonsillectomy/Adenoidectomy, Postoperative PEDS care plan entered in error.

## 2023-10-17 ENCOUNTER — Other Ambulatory Visit: Payer: Self-pay

## 2023-10-17 ENCOUNTER — Ambulatory Visit (HOSPITAL_COMMUNITY): Payer: Medicare Other

## 2023-10-17 ENCOUNTER — Ambulatory Visit (HOSPITAL_COMMUNITY)
Admission: RE | Admit: 2023-10-17 | Discharge: 2023-10-17 | Disposition: A | Discharge status: Home / Self Care | Payer: Medicare Other | Attending: Urology | Admitting: Urology

## 2023-10-17 ENCOUNTER — Ambulatory Visit (HOSPITAL_COMMUNITY): Payer: Medicare Other | Admitting: Physician Assistant

## 2023-10-17 ENCOUNTER — Encounter (HOSPITAL_COMMUNITY)
Admission: RE | Disposition: A | Discharge status: Home / Self Care | Payer: Self-pay | Source: Home / Self Care | Attending: Urology

## 2023-10-17 ENCOUNTER — Encounter (HOSPITAL_COMMUNITY): Payer: Self-pay | Admitting: Urology

## 2023-10-17 ENCOUNTER — Ambulatory Visit (HOSPITAL_BASED_OUTPATIENT_CLINIC_OR_DEPARTMENT_OTHER): Payer: Medicare Other | Admitting: Anesthesiology

## 2023-10-17 DIAGNOSIS — N816 Rectocele: Secondary | ICD-10-CM | POA: Diagnosis not present

## 2023-10-17 DIAGNOSIS — Z87442 Personal history of urinary calculi: Secondary | ICD-10-CM | POA: Insufficient documentation

## 2023-10-17 DIAGNOSIS — I10 Essential (primary) hypertension: Secondary | ICD-10-CM | POA: Diagnosis not present

## 2023-10-17 DIAGNOSIS — N2889 Other specified disorders of kidney and ureter: Secondary | ICD-10-CM | POA: Diagnosis not present

## 2023-10-17 DIAGNOSIS — N201 Calculus of ureter: Secondary | ICD-10-CM

## 2023-10-17 HISTORY — PX: CYSTOSCOPY/URETEROSCOPY/HOLMIUM LASER/STENT PLACEMENT: SHX6546

## 2023-10-17 SURGERY — CYSTOSCOPY/URETEROSCOPY/HOLMIUM LASER/STENT PLACEMENT
Anesthesia: General | Laterality: Left

## 2023-10-17 MED ORDER — CEFAZOLIN SODIUM-DEXTROSE 2-4 GM/100ML-% IV SOLN
2.0000 g | INTRAVENOUS | Status: AC
  Administered 2023-10-17: 2 g via INTRAVENOUS
  Filled 2023-10-17: qty 100

## 2023-10-17 MED ORDER — AMISULPRIDE (ANTIEMETIC) 5 MG/2ML IV SOLN
INTRAVENOUS | Status: AC
  Filled 2023-10-17: qty 4

## 2023-10-17 MED ORDER — LIDOCAINE HCL (PF) 2 % IJ SOLN
INTRAMUSCULAR | Status: AC
  Filled 2023-10-17: qty 5

## 2023-10-17 MED ORDER — FENTANYL CITRATE (PF) 100 MCG/2ML IJ SOLN
INTRAMUSCULAR | Status: AC
  Filled 2023-10-17: qty 2

## 2023-10-17 MED ORDER — LIDOCAINE 2% (20 MG/ML) 5 ML SYRINGE
INTRAMUSCULAR | Status: DC | PRN
  Administered 2023-10-17: 20 mg via INTRAVENOUS

## 2023-10-17 MED ORDER — ACETAMINOPHEN 500 MG PO TABS
1000.0000 mg | ORAL_TABLET | Freq: Once | ORAL | Status: DC
  Filled 2023-10-17: qty 2

## 2023-10-17 MED ORDER — SODIUM CHLORIDE 0.9 % IR SOLN
Status: DC | PRN
  Administered 2023-10-17: 3000 mL

## 2023-10-17 MED ORDER — ONDANSETRON HCL 4 MG/2ML IJ SOLN
INTRAMUSCULAR | Status: DC | PRN
  Administered 2023-10-17: 4 mg via INTRAVENOUS

## 2023-10-17 MED ORDER — PROPOFOL 10 MG/ML IV BOLUS
INTRAVENOUS | Status: DC | PRN
  Administered 2023-10-17: 140 mg via INTRAVENOUS

## 2023-10-17 MED ORDER — DEXAMETHASONE SODIUM PHOSPHATE 10 MG/ML IJ SOLN
INTRAMUSCULAR | Status: DC | PRN
  Administered 2023-10-17: 5 mg via INTRAVENOUS

## 2023-10-17 MED ORDER — FENTANYL CITRATE PF 50 MCG/ML IJ SOSY: 25.0000 ug | PREFILLED_SYRINGE | INTRAMUSCULAR | Status: DC | PRN

## 2023-10-17 MED ORDER — LACTATED RINGERS IV SOLN: INTRAVENOUS | Status: DC

## 2023-10-17 MED ORDER — DEXAMETHASONE SODIUM PHOSPHATE 10 MG/ML IJ SOLN
INTRAMUSCULAR | Status: AC
  Filled 2023-10-17: qty 1

## 2023-10-17 MED ORDER — CHLORHEXIDINE GLUCONATE 0.12 % MT SOLN
15.0000 mL | Freq: Once | OROMUCOSAL | Status: AC
  Administered 2023-10-17: 15 mL via OROMUCOSAL

## 2023-10-17 MED ORDER — FENTANYL CITRATE (PF) 100 MCG/2ML IJ SOLN
INTRAMUSCULAR | Status: DC | PRN
  Administered 2023-10-17: 25 ug via INTRAVENOUS
  Administered 2023-10-17: 50 ug via INTRAVENOUS
  Administered 2023-10-17: 25 ug via INTRAVENOUS

## 2023-10-17 MED ORDER — OXYCODONE HCL 5 MG/5ML PO SOLN: 5.0000 mg | Freq: Once | ORAL | Status: DC | PRN

## 2023-10-17 MED ORDER — ONDANSETRON HCL 4 MG/2ML IJ SOLN
INTRAMUSCULAR | Status: AC
  Filled 2023-10-17: qty 2

## 2023-10-17 MED ORDER — MIDAZOLAM HCL 2 MG/2ML IJ SOLN
0.5000 mg | Freq: Once | INTRAMUSCULAR | Status: DC | PRN
Start: 2023-10-17 — End: 2023-10-17

## 2023-10-17 MED ORDER — IOHEXOL 300 MG/ML  SOLN
INTRAMUSCULAR | Status: DC | PRN
  Administered 2023-10-17: 10 mL

## 2023-10-17 MED ORDER — OXYCODONE HCL 5 MG PO TABS: 5.0000 mg | ORAL_TABLET | Freq: Once | ORAL | Status: DC | PRN

## 2023-10-17 MED ORDER — AMISULPRIDE (ANTIEMETIC) 5 MG/2ML IV SOLN
10.0000 mg | Freq: Once | INTRAVENOUS | Status: AC
  Administered 2023-10-17: 10 mg via INTRAVENOUS

## 2023-10-17 MED ORDER — ORAL CARE MOUTH RINSE: 15.0000 mL | Freq: Once | OROMUCOSAL | Status: AC

## 2023-10-17 MED ORDER — PROPOFOL 10 MG/ML IV BOLUS
INTRAVENOUS | Status: AC
Start: 2023-10-17 — End: ?
  Filled 2023-10-17: qty 20

## 2023-10-17 SURGICAL SUPPLY — 25 items
BAG URO CATCHER STRL LF (MISCELLANEOUS) ×2 IMPLANT
BASKET ZERO TIP NITINOL 2.4FR (BASKET) IMPLANT
BSKT STON RTRVL ZERO TP 2.4FR (BASKET)
CATH URETL OPEN 5X70 (CATHETERS) ×2 IMPLANT
CLOTH BEACON ORANGE TIMEOUT ST (SAFETY) ×2 IMPLANT
DRSG TEGADERM 2-3/8X2-3/4 SM (GAUZE/BANDAGES/DRESSINGS) IMPLANT
EXTRACTOR STONE 1.7FRX115CM (UROLOGICAL SUPPLIES) IMPLANT
FIBER LASER MOSES 200 DFL (Laser) IMPLANT
FIBER LASER MOSES 365 DFL (Laser) IMPLANT
GLOVE BIO SURGEON STRL SZ 6.5 (GLOVE) ×2 IMPLANT
GOWN STRL REUS W/ TWL LRG LVL3 (GOWN DISPOSABLE) ×2 IMPLANT
GOWN STRL REUS W/TWL LRG LVL3 (GOWN DISPOSABLE) ×1
GUIDEWIRE STR DUAL SENSOR (WIRE) ×2 IMPLANT
KIT TURNOVER KIT A (KITS) IMPLANT
LASER FIB FLEXIVA PULSE ID 365 (Laser) IMPLANT
MANIFOLD NEPTUNE II (INSTRUMENTS) ×2 IMPLANT
PACK CYSTO (CUSTOM PROCEDURE TRAY) ×2 IMPLANT
PAD PREP 24X48 CUFFED NSTRL (MISCELLANEOUS) ×2 IMPLANT
SHEATH NAVIGATOR HD 11/13X28 (SHEATH) IMPLANT
SHEATH NAVIGATOR HD 11/13X36 (SHEATH) IMPLANT
STENT URET 6FRX24 CONTOUR (STENTS) IMPLANT
TRACTIP FLEXIVA PULS ID 200XHI (Laser) IMPLANT
TRACTIP FLEXIVA PULSE ID 200 (Laser)
TUBING CONNECTING 10 (TUBING) ×2 IMPLANT
TUBING UROLOGY SET (TUBING) ×2 IMPLANT

## 2023-10-17 NOTE — H&P (Signed)
CC/HPI: cc: Urolithiasis   12/14/2020: 72 year old woman with a history of urolithiasis who most recently underwent ureteroscopy with laser lithotripsy in September 2021 at [facility]. CT at that time showed multiple bilateral nonobstructing renal calculi as well as a right proximal ureteral calculus. Patient has passed stones on her own previously. She is here to establish care with a urologist. She feels well today and is without complaint.   01/28/2021: 72 year old woman with a history of urolithiasis returns for follow-up after 24 hour urine was completed. Results showed good urinary volume at 2.09 L with hyper oxalate urea and hypocitraturia. Patient is otherwise feeling well and has not passed any stones. She denies any gross hematuria. She is on potassium chloride prescribed by her primary care physician.   07/26/21: 72 year old woman with a history of urolithiasis here for follow-up. KUB shows punctate stone in left kidney. Patient has not had any gross hematuria or passed any stones in the interim. She denies any flank pain is otherwise feeling well. She is working on cutting back on tea and coffee and increasing her water.   09/14/22: 72 year old woman with a history of urolithiasis here for follow-up. She has done well over the last year and denies any flank pain, gross hematuria or UTIs. She has not passed any stones in the interim. She is headed to the beach this weekend.   09/27/23: 72 yo woman with hx of urolithiasis and simple renal cysts here for follow up. No stones in the interim year. No flank pain or gross hematuria.   10/03/2023: 72 year old woman with a history of urolithiasis and simple renal cysts presented to the ED with flank pain found to have a 3 mm left ureteral calculus. Her pain is well-controlled with pain medication but she is having nausea refractory to Zofran. No fevers or chills.     ALLERGIES: Codeine    MEDICATIONS: Allopurinol 300 mg tablet  Tamsulosin Hcl 0.4 mg  capsule  Losartan Potassium 100 mg tablet  Ondansetron Hcl 4 mg tablet  Oxycodone-Acetaminophen 5 mg-325 mg tablet  Potassium  Rosuvastatin Calcium 5 mg tablet  Sucralfate 1 gram tablet     GU PSH: No GU PSH    NON-GU PSH: Visit Complexity (formerly [facility]) - 09/27/2023     GU PMH: History of urolithiasis - 09/27/2023 Renal cyst - 09/27/2023, - 09/14/2022 Renal calculus - 09/14/2022, - 2022, - 2022, We discussed behavior modification for stone prevention including drinking 2-3 L of water a day, limiting animal protein and limiting salt intake. In addition since she has had multiple stones we are going to do a litholiink for 24 hour urine analysis. 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   Tobacco Use Assessment Completed: Used Tobacco in last 30 days? Has never drank.  Drinks 1 caffeinated drink per day.    REVIEW OF SYSTEMS:    GU Review Female:   Patient reports burning /pain with urination. Patient denies frequent urination, hard to postpone urination, get up at night to urinate, leakage of urine, stream starts and stops, trouble starting your stream, have to strain to urinate, and being pregnant.  Gastrointestinal (Upper):   Patient reports nausea and vomiting. Patient denies indigestion/ heartburn.  Gastrointestinal (  Lower):   Patient denies diarrhea and constipation.  Constitutional:   Patient denies fever, night sweats, weight loss, and fatigue.  Skin:   Patient denies skin rash/ lesion and itching.  Eyes:   Patient denies blurred vision and double vision.  Ears/ Nose/ Throat:   Patient denies sore throat and sinus problems.  Hematologic/Lymphatic:   Patient denies swollen glands  and easy bruising.  Cardiovascular:   Patient denies leg swelling and chest pains.  Respiratory:   Patient denies cough and shortness of breath.  Endocrine:   Patient denies excessive thirst.  Musculoskeletal:   Patient reports back pain. Patient denies joint pain.  Neurological:   Patient denies headaches and dizziness.  Psychologic:   Patient denies depression and anxiety.   VITAL SIGNS:      10/04/2023 09:11 AM  BP 119/80 mmHg  Pulse 81 /min  Temperature 97.3 F / 36.2 C   MULTI-SYSTEM PHYSICAL EXAMINATION:    Constitutional: Well-nourished. No physical deformities. Normally developed. Good grooming.  Neck: Neck symmetrical, not swollen. Normal tracheal position.  Respiratory: No labored breathing, no use of accessory muscles.   Skin: No paleness, no jaundice, no cyanosis. No lesion, no ulcer, no rash.  Neurologic / Psychiatric: Oriented to time, oriented to place, oriented to person. No depression, no anxiety, no agitation.  Eyes: Normal conjunctivae. Normal eyelids.  Ears, Nose, Mouth, and Throat: Left ear no scars, no lesions, no masses. Right ear no scars, no lesions, no masses. Nose no scars, no lesions, no masses. Normal hearing. Normal lips.  Musculoskeletal: Normal gait and station of head and neck.     Complexity of Data:  Records Review:   Previous Patient Records, POC Tool  Urine Test Review:   Urinalysis  X-Ray Review: KUB: Reviewed Films. Discussed With Patient. unable to visualize ureteral calculus C.T. Abdomen/Pelvis: Reviewed Films. Reviewed Report. Discussed With Patient. IMPRESSION:  1. Mild left-sided hydronephrosis secondary to a proximal left  ureteric 3 mm stone, similar to 09/28/2023.  2. Right nephrolithiasis.  3. Tiny hiatal hernia.  4. Pelvic floor laxity.  5. Aortic Atherosclerosis (ICD10-I70.0).    Electronically Signed  By: Jeronimo Greaves M.D.  On: 09/30/2023 09:08    PROCEDURES:         KUB - 74018  A single view of the abdomen is obtained.       Patient confirmed No Neulasta OnPro Device.           Urinalysis w/Scope Dipstick Dipstick Cont'd Micro  Color: Yellow Bilirubin: Neg mg/dL WBC/hpf: 0 - 5/hpf  Appearance: Slightly Cloudy Ketones: Trace mg/dL RBC/hpf: 3 - 16/XWR  Specific Gravity: 1.015 Blood: 3+ ery/uL Bacteria: Few (10-25/hpf)  pH: 6.5 Protein: Trace mg/dL Cystals: NS (Not Seen)  Glucose: Neg mg/dL Urobilinogen: 0.2 mg/dL Casts: Granular    Nitrites: Neg Trichomonas: Not Present    Leukocyte Esterase: Trace leu/uL Mucous: Present      Epithelial Cells: 0 - 5/hpf      Yeast: NS (Not Seen)      Sperm: Not Present    Notes: **QNS for spun micro**          Phenergan 25mg  - [id number], [id number] Wasted   NDC#0641-0928-21   Qty: 25 Adm. By: Kasandra Knudsen, M.D.  Unit: mg Lot No [id number]  Route: IM Exp. Date 10/26/2024  Freq: None Mfgr.:   Site: Right Buttock   ASSESSMENT:      ICD-10 Details  1 GU:   Renal calculus - N20.0 Chronic, Stable  2  Ureteral calculus - N20.1 Acute, Stable   PLAN:            Medications New Meds: Percocet 5 mg-325 mg tablet 1 tablet PO Q 6 H PRN severe kidney stone pain  #20  0 Refill(s)  Tamsulosin Hcl 0.4 mg capsule 1 capsule PO Q HS   #30  1 Refill(s)  Ondansetron Hcl 4 mg tablet 1 tablet PO Q 8 H PRN   #30  1 Refill(s)  Pharmacy Name:  [facility] [phone]  Address:  [address]   [place], [place] [address]  Phone:  [phone]  Fax:  [phone]            Orders X-Rays: KUB          Schedule Return Visit/Planned Activity: 1 Week - Extender          Document Letter(s):  Created for Patient: Clinical Summary         Notes:   Urolithiasis:  -Unable to visualize ureteral calculus on KUB. Patient prescriptions including tamsulosin, Percocet and Zofran were refilled today. She also received Phenergan in the office for nausea. She will try and pass the stone on her own. Will set her up for ureteroscopy with laser lithotripsy in the event she does not pass the  stone. Risks and benefits of the procedure were discussed with the patient in detail including but not limited to pain, bleeding, infection, damage to surrounding structures, need for additional treatment, stent discomfort.

## 2023-10-17 NOTE — Anesthesia Procedure Notes (Signed)
Procedure Name: LMA Insertion Date/Time: 10/17/2023 12:13 PM  Performed by: Elyn Peers, CRNAPre-anesthesia Checklist: Patient identified, Emergency Drugs available, Suction available, Patient being monitored and Timeout performed Patient Re-evaluated:Patient Re-evaluated prior to induction Oxygen Delivery Method: Circle system utilized Preoxygenation: Pre-oxygenation with 100% oxygen Induction Type: IV induction Ventilation: Mask ventilation without difficulty LMA: LMA inserted LMA Size: 4.0 Number of attempts: 1 Placement Confirmation: positive ETCO2 and breath sounds checked- equal and bilateral Tube secured with: Tape Dental Injury: Teeth and Oropharynx as per pre-operative assessment

## 2023-10-17 NOTE — Interval H&P Note (Signed)
History and Physical Interval Note:  10/17/2023 11:24 AM  Demetrios Loll  has presented today for surgery, with the diagnosis of LEFT URETERAL CALCULUS.  The various methods of treatment have been discussed with the patient and family. After consideration of risks, benefits and other options for treatment, the patient has consented to  Procedure(s): CYSTOSCOPY WITH LEFT RETROGRADE PYELOGRAM, LEFT URETEROSCOPY, HOLMIUM LASER LITHOTRIPSY, AND LEFT URETERAL STENT PLACEMENT (Left) as a surgical intervention.  The patient's history has been reviewed, patient examined, no change in status, stable for surgery.  I have reviewed the patient's chart and labs.  Questions were answered to the patient's satisfaction.     Joelle Flessner D Mulki Roesler

## 2023-10-17 NOTE — Discharge Instructions (Addendum)
DISCHARGE INSTRUCTIONS FOR KIDNEY STONE/URETERAL STENT   MEDICATIONS:  1. Resume all your other meds from home  2. AZO over the counter can help with the burning/stinging when you urinate. 3. Oxycodone-acetaminophen is for moderate/severe pain, otherwise taking up to 1000 mg every 6 hours of plainTylenol will help treat your pain.   4. Tamsulosin can help with stent discomfort   ACTIVITY:  1. No strenuous activity x 1week  2. No driving while on narcotic pain medications  3. Drink plenty of water  4. Continue to walk at home - you can still get blood clots when you are at home, so keep active, but don't over do it.  5. May return to work/school tomorrow or when you feel ready   BATHING:  1. You can shower and we recommend daily showers    SIGNS/SYMPTOMS TO CALL:  Please call us if you have a fever greater than 101.5, uncontrolled nausea/vomiting, uncontrolled pain, dizziness, unable to urinate, bloody urine, chest pain, shortness of breath, leg swelling, leg pain, redness around wound, drainage from wound, or any other concerns or questions.   You can reach [place] at [phone].   FOLLOW-UP:  1. You you will be contacted for a follow up appointment and stent removal

## 2023-10-17 NOTE — Op Note (Signed)
Preoperative diagnosis: left ureteral calculus  Postoperative diagnosis: history of urolithiasis  Procedure:  Cystoscopy left diagnostic ureteroscopy left 27F x 24cm ureteral stent placement - no tether left retrograde pyelography with interpretation  Surgeon: Kasandra Knudsen, MD  Anesthesia: General  Complications: None  Intraoperative findings:  Normal urethra Bilateral orthotropic ureteral orifices left retrograde pyelography demonstrated no filling defect within the left ureter Bladder mucosa normal without masses  Grade 3 rectocele, small fleshy growth/? Redundant vaginal tissue on right lateral vaginal wall near introitus Smooth appearing protrusion/polyp in proximal ureter covered by mucosa.  Question submucosal stone versus benign lesion.  EBL: Minimal  Specimens: none  Disposition of specimens: [facility] for stone analysis  Indication: Karen Tucker is a 72 y.o.   patient with a 3mm left ureteral stone and associated left symptoms. After reviewing the management options for treatment, the patient elected to proceed with the above surgical procedure(s). We have discussed the potential benefits and risks of the procedure, side effects of the proposed treatment, the likelihood of the patient achieving the goals of the procedure, and any potential problems that might occur during the procedure or recuperation. Informed consent has been obtained.   Description of procedure:  The patient was taken to the operating room and general anesthesia was induced.  The patient was placed in the dorsal lithotomy position, prepped and draped in the usual sterile fashion, and preoperative antibiotics were administered. A preoperative time-out was performed.   Cystourethroscopy was performed.  The patient's urethra was examined and was normal.The bladder was then systematically examined in its entirety. There was no evidence for any bladder tumors, stones, or other  mucosal pathology.    Attention then turned to the left ureteral orifice and a ureteral catheter was used to intubate the ureteral orifice.  Omnipaque contrast was injected through the ureteral catheter and a retrograde pyelogram was performed with findings as dictated above.  A 0.38 sensor guidewire was then advanced up the left ureter into the renal pelvis under fluoroscopic guidance. The 4.5Fr semirigid ureteroscope was then advanced into the ureter next to the guidewire to the UPJ.  No stone was encountered in the ureter.  There was a smooth protrusion into the ureter covered by mucosa.  It is possible that  stone the stone seen with submucosal but this did not appear to be the case when gently compressing this area with the scope.  It did appear to be a benign process.  Next a second sensor wire was placed through the ureteroscope and advanced to the kidney with fluoroscopic guidance.  This ureteroscope was removed.  A ureteral access sheath was placed over the second wire advanced the proximal ureter with fluoroscopy.  The inner sheath and wire were removed.  Flexible ureteroscopy then took place.  There were no stones or other abnormalities noted in the kidney.  The access sheath was then removed in unison with the ureteroscope taking care to examine the ureter on the way out.  No trauma or injury was noted to the ureter.  The wire was then backloaded through the cystoscope and a ureteral stent was advance over the wire using Seldinger technique.  The stent was positioned appropriately under fluoroscopic and cystoscopic guidance.  The wire was then removed with an adequate stent curl noted in the renal pelvis as well as in the bladder.  The bladder was then emptied and the procedure ended.  The patient appeared to tolerate the procedure well and without complications.  The patient  was able to be awakened and transferred to the recovery unit in satisfactory condition.   Disposition: Stent will be  removed in the office in approximately 1 week.  Will change patient's renal ultrasound to CT scan to reassess this area.

## 2023-10-17 NOTE — Anesthesia Preprocedure Evaluation (Addendum)
Anesthesia Evaluation  Patient identified by MRN, date of birth, ID band Patient awake    Reviewed: Allergy & Precautions, NPO status , Patient's Chart, lab work & pertinent test results  History of Anesthesia Complications (+) PONV  Airway Mallampati: I  TM Distance: >3 FB Neck ROM: Full    Dental  (+) Dental Advisory Given   Pulmonary neg pulmonary ROS   breath sounds clear to auscultation       Cardiovascular hypertension, Pt. on medications (-) angina  Rhythm:Regular Rate:Normal     Neuro/Psych negative neurological ROS     GI/Hepatic negative GI ROS, Neg liver ROS,,,  Endo/Other  BMI 31  Renal/GU H/o stones     Musculoskeletal   Abdominal   Peds  Hematology negative hematology ROS (+)   Anesthesia Other Findings   Reproductive/Obstetrics                             Anesthesia Physical Anesthesia Plan  ASA: 2  Anesthesia Plan: General   Post-op Pain Management: Tylenol PO (pre-op)*   Induction: Intravenous  PONV Risk Score and Plan: 3 and Ondansetron, Dexamethasone and Treatment may vary due to age or medical condition  Airway Management Planned: LMA  Additional Equipment: None  Intra-op Plan:   Post-operative Plan:   Informed Consent: I have reviewed the patients History and Physical, chart, labs and discussed the procedure including the risks, benefits and alternatives for the proposed anesthesia with the patient or authorized representative who has indicated his/her understanding and acceptance.     Dental advisory given  Plan Discussed with: CRNA and Surgeon  Anesthesia Plan Comments:         Anesthesia Quick Evaluation

## 2023-10-17 NOTE — Anesthesia Postprocedure Evaluation (Signed)
Anesthesia Post Note  Patient: Karen Tucker  Procedure(s) Performed: CYSTOSCOPY WITH LEFT RETROGRADE PYELOGRAM, LEFT URETEROSCOPY, and LEFT URETERAL STENT PLACEMENT (Left)     Patient location during evaluation: PACU Anesthesia Type: General Level of consciousness: awake and alert, patient cooperative and oriented Pain management: pain level controlled Vital Signs Assessment: post-procedure vital signs reviewed and stable Respiratory status: spontaneous breathing, nonlabored ventilation and respiratory function stable Cardiovascular status: blood pressure returned to baseline and stable : nausea resolved. Anesthetic complications: no   No notable events documented.  Last Vitals:  Vitals:   10/17/23 1330 10/17/23 1345  BP: (!) 121/55 (!) 143/94  Pulse: 78 83  Resp: 12 16  Temp: 36.5 C   SpO2: 100% 99%    Last Pain:  Vitals:   10/17/23 1345  TempSrc:   PainSc: 0-No pain                 Vidit Boissonneault,E. Derrious Bologna

## 2023-10-17 NOTE — Transfer of Care (Signed)
Immediate Anesthesia Transfer of Care Note  Patient: Karen Tucker  Procedure(s) Performed: CYSTOSCOPY WITH LEFT RETROGRADE PYELOGRAM, LEFT URETEROSCOPY, and LEFT URETERAL STENT PLACEMENT (Left)  Patient Location: PACU  Anesthesia Type:General  Level of Consciousness: awake, drowsy, and responds to stimulation  Airway & Oxygen Therapy: Patient Spontanous Breathing and Patient connected to face mask oxygen  Post-op Assessment: Report given to RN  Post vital signs: Reviewed and stable  Last Vitals:  Vitals Value Taken Time  BP 123/69 10/17/23 1251  Temp    Pulse 86 10/17/23 1253  Resp 14 10/17/23 1253  SpO2 100 % 10/17/23 1253  Vitals shown include unfiled device data.  Last Pain:  Vitals:   10/17/23 0940  TempSrc: Oral         Complications: No notable events documented.

## 2023-10-18 ENCOUNTER — Encounter (HOSPITAL_COMMUNITY): Payer: Self-pay | Admitting: Urology

## 2023-10-25 DIAGNOSIS — R8271 Bacteriuria: Secondary | ICD-10-CM | POA: Diagnosis not present

## 2023-10-25 DIAGNOSIS — N202 Calculus of kidney with calculus of ureter: Secondary | ICD-10-CM | POA: Diagnosis not present

## 2023-11-06 DIAGNOSIS — N135 Crossing vessel and stricture of ureter without hydronephrosis: Secondary | ICD-10-CM | POA: Diagnosis not present

## 2023-11-07 DIAGNOSIS — K5730 Diverticulosis of large intestine without perforation or abscess without bleeding: Secondary | ICD-10-CM | POA: Diagnosis not present

## 2023-11-07 DIAGNOSIS — N200 Calculus of kidney: Secondary | ICD-10-CM | POA: Diagnosis not present

## 2023-11-07 DIAGNOSIS — N281 Cyst of kidney, acquired: Secondary | ICD-10-CM | POA: Diagnosis not present

## 2023-11-07 DIAGNOSIS — K449 Diaphragmatic hernia without obstruction or gangrene: Secondary | ICD-10-CM | POA: Diagnosis not present

## 2023-11-07 DIAGNOSIS — N202 Calculus of kidney with calculus of ureter: Secondary | ICD-10-CM | POA: Diagnosis not present

## 2023-11-15 DIAGNOSIS — N200 Calculus of kidney: Secondary | ICD-10-CM | POA: Diagnosis not present

## 2023-11-21 DIAGNOSIS — N816 Rectocele: Secondary | ICD-10-CM | POA: Diagnosis not present

## 2023-12-08 DIAGNOSIS — E1169 Type 2 diabetes mellitus with other specified complication: Secondary | ICD-10-CM | POA: Diagnosis not present

## 2023-12-08 DIAGNOSIS — E559 Vitamin D deficiency, unspecified: Secondary | ICD-10-CM | POA: Diagnosis not present

## 2023-12-08 DIAGNOSIS — M75101 Unspecified rotator cuff tear or rupture of right shoulder, not specified as traumatic: Secondary | ICD-10-CM | POA: Diagnosis not present

## 2023-12-08 DIAGNOSIS — E876 Hypokalemia: Secondary | ICD-10-CM | POA: Diagnosis not present

## 2023-12-08 DIAGNOSIS — K2100 Gastro-esophageal reflux disease with esophagitis, without bleeding: Secondary | ICD-10-CM | POA: Diagnosis not present

## 2023-12-08 DIAGNOSIS — E7849 Other hyperlipidemia: Secondary | ICD-10-CM | POA: Diagnosis not present

## 2023-12-08 DIAGNOSIS — M75102 Unspecified rotator cuff tear or rupture of left shoulder, not specified as traumatic: Secondary | ICD-10-CM | POA: Diagnosis not present

## 2023-12-11 DIAGNOSIS — J479 Bronchiectasis, uncomplicated: Secondary | ICD-10-CM | POA: Diagnosis not present

## 2023-12-11 DIAGNOSIS — I700 Atherosclerosis of aorta: Secondary | ICD-10-CM | POA: Diagnosis not present

## 2023-12-11 DIAGNOSIS — K7581 Nonalcoholic steatohepatitis (NASH): Secondary | ICD-10-CM | POA: Diagnosis not present

## 2023-12-11 DIAGNOSIS — K2100 Gastro-esophageal reflux disease with esophagitis, without bleeding: Secondary | ICD-10-CM | POA: Diagnosis not present

## 2023-12-11 DIAGNOSIS — M19032 Primary osteoarthritis, left wrist: Secondary | ICD-10-CM | POA: Diagnosis not present

## 2023-12-11 DIAGNOSIS — E7849 Other hyperlipidemia: Secondary | ICD-10-CM | POA: Diagnosis not present

## 2023-12-11 DIAGNOSIS — E876 Hypokalemia: Secondary | ICD-10-CM | POA: Diagnosis not present

## 2023-12-11 DIAGNOSIS — E1169 Type 2 diabetes mellitus with other specified complication: Secondary | ICD-10-CM | POA: Diagnosis not present

## 2023-12-11 DIAGNOSIS — I10 Essential (primary) hypertension: Secondary | ICD-10-CM | POA: Diagnosis not present

## 2023-12-15 DIAGNOSIS — H43391 Other vitreous opacities, right eye: Secondary | ICD-10-CM | POA: Diagnosis not present

## 2023-12-15 DIAGNOSIS — H524 Presbyopia: Secondary | ICD-10-CM | POA: Diagnosis not present

## 2023-12-29 DIAGNOSIS — Z23 Encounter for immunization: Secondary | ICD-10-CM | POA: Diagnosis not present

## 2024-01-02 DIAGNOSIS — Z1231 Encounter for screening mammogram for malignant neoplasm of breast: Secondary | ICD-10-CM | POA: Diagnosis not present

## 2024-02-02 DIAGNOSIS — R051 Acute cough: Secondary | ICD-10-CM | POA: Diagnosis not present

## 2024-05-13 DIAGNOSIS — N200 Calculus of kidney: Secondary | ICD-10-CM | POA: Diagnosis not present

## 2024-05-13 DIAGNOSIS — N281 Cyst of kidney, acquired: Secondary | ICD-10-CM | POA: Diagnosis not present

## 2024-05-16 DIAGNOSIS — R053 Chronic cough: Secondary | ICD-10-CM | POA: Diagnosis not present

## 2024-05-16 DIAGNOSIS — K219 Gastro-esophageal reflux disease without esophagitis: Secondary | ICD-10-CM | POA: Diagnosis not present

## 2024-05-24 DIAGNOSIS — M25512 Pain in left shoulder: Secondary | ICD-10-CM | POA: Diagnosis not present

## 2024-06-12 DIAGNOSIS — H40011 Open angle with borderline findings, low risk, right eye: Secondary | ICD-10-CM | POA: Diagnosis not present

## 2024-06-17 DIAGNOSIS — D559 Anemia due to enzyme disorder, unspecified: Secondary | ICD-10-CM | POA: Diagnosis not present

## 2024-06-17 DIAGNOSIS — E7849 Other hyperlipidemia: Secondary | ICD-10-CM | POA: Diagnosis not present

## 2024-06-17 DIAGNOSIS — K219 Gastro-esophageal reflux disease without esophagitis: Secondary | ICD-10-CM | POA: Diagnosis not present

## 2024-06-17 DIAGNOSIS — E1169 Type 2 diabetes mellitus with other specified complication: Secondary | ICD-10-CM | POA: Diagnosis not present

## 2024-06-17 DIAGNOSIS — E559 Vitamin D deficiency, unspecified: Secondary | ICD-10-CM | POA: Diagnosis not present

## 2024-06-17 DIAGNOSIS — I10 Essential (primary) hypertension: Secondary | ICD-10-CM | POA: Diagnosis not present

## 2024-06-24 DIAGNOSIS — Z1389 Encounter for screening for other disorder: Secondary | ICD-10-CM | POA: Diagnosis not present

## 2024-06-24 DIAGNOSIS — Z0001 Encounter for general adult medical examination with abnormal findings: Secondary | ICD-10-CM | POA: Diagnosis not present

## 2024-06-24 DIAGNOSIS — E7849 Other hyperlipidemia: Secondary | ICD-10-CM | POA: Diagnosis not present

## 2024-06-24 DIAGNOSIS — N200 Calculus of kidney: Secondary | ICD-10-CM | POA: Diagnosis not present

## 2024-06-24 DIAGNOSIS — E782 Mixed hyperlipidemia: Secondary | ICD-10-CM | POA: Diagnosis not present

## 2024-06-24 DIAGNOSIS — E559 Vitamin D deficiency, unspecified: Secondary | ICD-10-CM | POA: Diagnosis not present

## 2024-06-24 DIAGNOSIS — I10 Essential (primary) hypertension: Secondary | ICD-10-CM | POA: Diagnosis not present

## 2024-07-12 DIAGNOSIS — K5792 Diverticulitis of intestine, part unspecified, without perforation or abscess without bleeding: Secondary | ICD-10-CM | POA: Diagnosis not present

## 2024-07-17 DIAGNOSIS — N200 Calculus of kidney: Secondary | ICD-10-CM | POA: Diagnosis not present

## 2024-07-17 DIAGNOSIS — K449 Diaphragmatic hernia without obstruction or gangrene: Secondary | ICD-10-CM | POA: Diagnosis not present

## 2024-07-17 DIAGNOSIS — R918 Other nonspecific abnormal finding of lung field: Secondary | ICD-10-CM | POA: Diagnosis not present

## 2024-07-17 DIAGNOSIS — R1032 Left lower quadrant pain: Secondary | ICD-10-CM | POA: Diagnosis not present

## 2024-07-17 DIAGNOSIS — K5732 Diverticulitis of large intestine without perforation or abscess without bleeding: Secondary | ICD-10-CM | POA: Diagnosis not present

## 2024-07-24 DIAGNOSIS — H40013 Open angle with borderline findings, low risk, bilateral: Secondary | ICD-10-CM | POA: Diagnosis not present

## 2024-07-24 DIAGNOSIS — H04123 Dry eye syndrome of bilateral lacrimal glands: Secondary | ICD-10-CM | POA: Diagnosis not present

## 2024-07-24 DIAGNOSIS — H35363 Drusen (degenerative) of macula, bilateral: Secondary | ICD-10-CM | POA: Diagnosis not present

## 2024-07-24 DIAGNOSIS — H2511 Age-related nuclear cataract, right eye: Secondary | ICD-10-CM | POA: Diagnosis not present

## 2024-07-24 DIAGNOSIS — H2513 Age-related nuclear cataract, bilateral: Secondary | ICD-10-CM | POA: Diagnosis not present

## 2024-07-25 DIAGNOSIS — R911 Solitary pulmonary nodule: Secondary | ICD-10-CM | POA: Diagnosis not present

## 2024-07-25 DIAGNOSIS — R9389 Abnormal findings on diagnostic imaging of other specified body structures: Secondary | ICD-10-CM | POA: Diagnosis not present

## 2024-07-25 DIAGNOSIS — R918 Other nonspecific abnormal finding of lung field: Secondary | ICD-10-CM | POA: Diagnosis not present

## 2024-08-16 DIAGNOSIS — N281 Cyst of kidney, acquired: Secondary | ICD-10-CM | POA: Diagnosis not present

## 2024-08-16 DIAGNOSIS — N200 Calculus of kidney: Secondary | ICD-10-CM | POA: Diagnosis not present

## 2024-09-17 DIAGNOSIS — H268 Other specified cataract: Secondary | ICD-10-CM | POA: Diagnosis not present

## 2024-09-17 DIAGNOSIS — H25012 Cortical age-related cataract, left eye: Secondary | ICD-10-CM | POA: Diagnosis not present

## 2024-09-17 DIAGNOSIS — H2511 Age-related nuclear cataract, right eye: Secondary | ICD-10-CM | POA: Diagnosis not present

## 2024-09-17 DIAGNOSIS — H25042 Posterior subcapsular polar age-related cataract, left eye: Secondary | ICD-10-CM | POA: Diagnosis not present

## 2024-09-17 DIAGNOSIS — H2512 Age-related nuclear cataract, left eye: Secondary | ICD-10-CM | POA: Diagnosis not present

## 2024-09-17 DIAGNOSIS — I10 Essential (primary) hypertension: Secondary | ICD-10-CM | POA: Diagnosis not present

## 2024-09-24 DIAGNOSIS — I10 Essential (primary) hypertension: Secondary | ICD-10-CM | POA: Diagnosis not present

## 2024-09-24 DIAGNOSIS — H2512 Age-related nuclear cataract, left eye: Secondary | ICD-10-CM | POA: Diagnosis not present

## 2024-09-24 DIAGNOSIS — H268 Other specified cataract: Secondary | ICD-10-CM | POA: Diagnosis not present

## 2024-09-27 DIAGNOSIS — R911 Solitary pulmonary nodule: Secondary | ICD-10-CM | POA: Diagnosis not present

## 2024-10-11 DIAGNOSIS — E1165 Type 2 diabetes mellitus with hyperglycemia: Secondary | ICD-10-CM | POA: Diagnosis not present

## 2024-10-11 DIAGNOSIS — E7849 Other hyperlipidemia: Secondary | ICD-10-CM | POA: Diagnosis not present

## 2024-10-18 DIAGNOSIS — M1000 Idiopathic gout, unspecified site: Secondary | ICD-10-CM | POA: Diagnosis not present

## 2024-10-18 DIAGNOSIS — E559 Vitamin D deficiency, unspecified: Secondary | ICD-10-CM | POA: Diagnosis not present

## 2024-10-18 DIAGNOSIS — K7581 Nonalcoholic steatohepatitis (NASH): Secondary | ICD-10-CM | POA: Diagnosis not present

## 2024-10-18 DIAGNOSIS — N200 Calculus of kidney: Secondary | ICD-10-CM | POA: Diagnosis not present

## 2024-10-18 DIAGNOSIS — Z23 Encounter for immunization: Secondary | ICD-10-CM | POA: Diagnosis not present

## 2024-10-18 DIAGNOSIS — R911 Solitary pulmonary nodule: Secondary | ICD-10-CM | POA: Diagnosis not present

## 2024-10-18 DIAGNOSIS — E1165 Type 2 diabetes mellitus with hyperglycemia: Secondary | ICD-10-CM | POA: Diagnosis not present

## 2024-10-18 DIAGNOSIS — I10 Essential (primary) hypertension: Secondary | ICD-10-CM | POA: Diagnosis not present

## 2024-11-23 ENCOUNTER — Emergency Department (HOSPITAL_COMMUNITY)

## 2024-11-23 ENCOUNTER — Other Ambulatory Visit: Payer: Self-pay

## 2024-11-23 ENCOUNTER — Emergency Department (HOSPITAL_COMMUNITY)
Admission: EM | Admit: 2024-11-23 | Discharge: 2024-11-23 | Disposition: A | Discharge status: Home / Self Care | Attending: Emergency Medicine | Admitting: Emergency Medicine

## 2024-11-23 DIAGNOSIS — I10 Essential (primary) hypertension: Secondary | ICD-10-CM | POA: Insufficient documentation

## 2024-11-23 DIAGNOSIS — R109 Unspecified abdominal pain: Secondary | ICD-10-CM | POA: Diagnosis present

## 2024-11-23 DIAGNOSIS — N12 Tubulo-interstitial nephritis, not specified as acute or chronic: Secondary | ICD-10-CM | POA: Diagnosis not present

## 2024-11-23 DIAGNOSIS — R112 Nausea with vomiting, unspecified: Secondary | ICD-10-CM | POA: Insufficient documentation

## 2024-11-23 DIAGNOSIS — D72829 Elevated white blood cell count, unspecified: Secondary | ICD-10-CM | POA: Diagnosis not present

## 2024-11-23 DIAGNOSIS — Z79899 Other long term (current) drug therapy: Secondary | ICD-10-CM | POA: Diagnosis not present

## 2024-11-23 LAB — CBC
HCT: 38.9 % (ref 36.0–46.0)
Hemoglobin: 13 g/dL (ref 12.0–15.0)
MCH: 30.9 pg (ref 26.0–34.0)
MCHC: 33.4 g/dL (ref 30.0–36.0)
MCV: 92.4 fL (ref 80.0–100.0)
Platelets: 136 K/uL — ABNORMAL LOW (ref 150–400)
RBC: 4.21 MIL/uL (ref 3.87–5.11)
RDW: 13.8 % (ref 11.5–15.5)
WBC: 11.5 K/uL — ABNORMAL HIGH (ref 4.0–10.5)
nRBC: 0 % (ref 0.0–0.2)

## 2024-11-23 LAB — URINALYSIS, ROUTINE W REFLEX MICROSCOPIC
Bacteria, UA: NONE SEEN
Bilirubin Urine: NEGATIVE
Glucose, UA: NEGATIVE mg/dL
Ketones, ur: 5 mg/dL — AB
Nitrite: NEGATIVE
Protein, ur: 100 mg/dL — AB
RBC / HPF: >50 RBC/hpf (ref 0–5)
Specific Gravity, Urine: 1.016 (ref 1.005–1.030)
WBC, UA: >50 WBC/hpf (ref 0–5)
pH: 5 (ref 5.0–8.0)

## 2024-11-23 LAB — COMPREHENSIVE METABOLIC PANEL WITH GFR
ALT: 13 U/L (ref 0–44)
AST: 27 U/L (ref 15–41)
Albumin: 4.2 g/dL (ref 3.5–5.0)
Alkaline Phosphatase: 79 U/L (ref 38–126)
Anion gap: 15 (ref 5–15)
BUN: 13 mg/dL (ref 8–23)
CO2: 18 mmol/L — ABNORMAL LOW (ref 22–32)
Calcium: 9 mg/dL (ref 8.9–10.3)
Chloride: 104 mmol/L (ref 98–111)
Creatinine, Ser: 0.69 mg/dL (ref 0.44–1.00)
GFR, Estimated: >60 mL/min (ref >60)
Glucose, Bld: 178 mg/dL — ABNORMAL HIGH (ref 70–99)
Potassium: 2.9 mmol/L — ABNORMAL LOW (ref 3.5–5.1)
Sodium: 136 mmol/L (ref 135–145)
Total Bilirubin: 2.9 mg/dL — ABNORMAL HIGH (ref 0.0–1.2)
Total Protein: 7 g/dL (ref 6.5–8.1)

## 2024-11-23 LAB — LIPASE, BLOOD: Lipase: 26 U/L (ref 11–51)

## 2024-11-23 MED ORDER — ONDANSETRON HCL 4 MG PO TABS: 4.0000 mg | ORAL_TABLET | Freq: Four times a day (QID) | ORAL | 0 refills | Status: AC

## 2024-11-23 MED ORDER — SODIUM CHLORIDE 0.9 % IV BOLUS
1000.0000 mL | Freq: Once | INTRAVENOUS | Status: AC
Start: 2024-11-23 — End: 2024-11-23
  Administered 2024-11-23: 1000 mL via INTRAVENOUS

## 2024-11-23 MED ORDER — ONDANSETRON HCL 4 MG/2ML IJ SOLN
4.0000 mg | Freq: Once | INTRAMUSCULAR | Status: AC
  Administered 2024-11-23: 4 mg via INTRAVENOUS
  Filled 2024-11-23: qty 2

## 2024-11-23 MED ORDER — ONDANSETRON HCL 4 MG/2ML IJ SOLN: 4.0000 mg | Freq: Once | INTRAMUSCULAR | Status: DC | PRN

## 2024-11-23 MED ORDER — CEPHALEXIN 500 MG PO CAPS: 500.0000 mg | ORAL_CAPSULE | Freq: Four times a day (QID) | ORAL | 0 refills | Status: AC

## 2024-11-23 NOTE — Discharge Instructions (Addendum)
 Your testing today showed that you have possibly had a recent kidney stone but it may have passed.  I suspect that you do have a kidney infection from a bacteria that needs to be treated with an antibiotic.  The medication that I would like for you to take over the next 7 days is called cephalexin, this is 500 mg by mouth 4 times a day, take it every 6 hours until it is completed.  We have sent a urine culture and if it shows that you need a different antibiotic you will be called by phone.  If you develop severe or worsening symptoms including fever, vomiting, weakness, pain or any other concerning findings please return to the ER immediately otherwise you should see your family doctor on Monday for recheck and to discuss your urine culture.  You have been prescribed Cephalexin, 500 mg capsules.  This is an antibiotic that is used to treat multiple different infections, please take this medicine until it has completed - even if you are feeling better immediately.  As a side effect, this may cause diarrhea in some people, if you are developing any signs of an allergic reaction please stop the medication immediately and call your doctor or return to the emergency department for worsening symptoms  Zofran  is a medication which can help with nausea.  You may take 4 mg by mouth every 6 hours as needed if you are an adult, if your child under the age of 6 take half of a tablet or 2 mg every 6 hours as needed.  This should dissolve on your tongue within a short timeframe.  Wait about 30 minutes after taking it to help with drinking clear liquids.  Thank you for allowing us  to treat you in the emergency department today.  After reviewing your examination and potential testing that was done it appears that you are safe to go home.  I would like for you to follow-up with your doctor within the next several days, have them obtain your records and follow-up with them to review all potential tests and results from your  visit.  If you should develop severe or worsening symptoms return to the emergency department immediately

## 2024-11-23 NOTE — ED Triage Notes (Signed)
 Pt arrived [facility] from home with c/o n/v since last night and abd pain since Thurs. 250ml of NS - 4 mg zofran  given by ems.

## 2024-11-23 NOTE — ED Provider Notes (Signed)
 [facility] EMERGENCY DEPARTMENT AT [facility] Provider Note   CSN: [id number] Arrival date & time: 11/23/24  1524     Patient presents with: Emesis   Karen Tucker is a 73 y.o. female.    Emesis  This patient is a 73 year old female, history of hypertension, she presents to the hospital today with a complaint of nausea, she has been having some abdominal and back discomfort but mostly nausea and vomiting since yesterday.  She had gone to her doctor's office this morning where she had a urine sample that was evidently positive for UTI given a shot of an antibiotic and told to start taking antibiotics tomorrow morning.  Because of her ongoing symptoms her husband decided to bring her to the hospital.  She denies pain, denies objective fevers but has had subjective fevers and chills, no diarrhea no swelling no rashes.  The patient denies having a prior kidney infection in the past though she does report having kidney stones that she was told about earlier within the last year.  Reviewing the medical record, the patient had a CT scan of the abdomen and pelvis in October 2024, it showed that she had mild left-sided hydronephrosis from a proximal 3 mm stone, she does not recall passing it    Prior to Admission medications   Medication Sig Start Date End Date Taking? Authorizing Provider  cephALEXin (KEFLEX) 500 MG capsule Take 1 capsule (500 mg total) by mouth 4 (four) times daily for 7 days. 11/23/24 11/30/24 Yes Cleotilde Rogue, MD  ondansetron  (ZOFRAN ) 4 MG tablet Take 1 tablet (4 mg total) by mouth every 6 (six) hours. 11/23/24  Yes Cleotilde Rogue, MD  acetaminophen  (TYLENOL ) 500 MG tablet Take 500 mg by mouth every 6 (six) hours as needed (pain.).    [provider]  allopurinol  (ZYLOPRIM ) 300 MG tablet Take 300 mg by mouth every evening.    [provider]  HYDROcodone-acetaminophen  (NORCO/VICODIN) 5-325 MG tablet Take 1 tablet by mouth 4 (four) times daily.  09/28/23   [provider]  ibuprofen (ADVIL) 200 MG tablet Take 400 mg by mouth every 8 (eight) hours as needed (pain.).    [provider]  losartan  (COZAAR ) 50 MG tablet Take 50 mg by mouth every evening.    [provider]  ondansetron  (ZOFRAN -ODT) 4 MG disintegrating tablet 4mg  ODT q4 hours prn nausea/vomit 09/30/23   Zammit, Joseph, MD  oxyCODONE -acetaminophen  (PERCOCET/ROXICET) 5-325 MG tablet Take 1 tablet by mouth every 6 (six) hours as needed for severe pain. 09/30/23   Zammit, Joseph, MD  potassium chloride  (KLOR-CON  M) 10 MEQ tablet Take 30 mEq by mouth in the morning and at bedtime. 09/05/23   [provider]  rosuvastatin (CRESTOR) 20 MG tablet Take 20 mg by mouth every Thursday.    [provider]  tamsulosin  (FLOMAX ) 0.4 MG CAPS capsule Take 1 capsule (0.4 mg total) by mouth daily. 09/30/23   Suzette Pac, MD    Allergies: Codeine, Lisinopril, and Morphine and codeine    Review of Systems  Gastrointestinal:  Positive for vomiting.  All other systems reviewed and are negative.   Updated Vital Signs BP 106/63   Pulse 88   Temp 99.2 F (37.3 C) (Oral)   Resp (!) 27   Ht 1.575 m (5' 2)   Wt 79.4 kg   SpO2 92%   BMI 32.01 kg/m   Physical Exam Vitals and nursing note reviewed.  Constitutional:      General: She  is not in acute distress.    Appearance: She is well-developed.  HENT:     Head: Normocephalic and atraumatic.     Mouth/Throat:     Mouth: Mucous membranes are dry.     Pharynx: No oropharyngeal exudate.  Eyes:     General: No scleral icterus.       Right eye: No discharge.        Left eye: No discharge.     Conjunctiva/sclera: Conjunctivae normal.     Pupils: Pupils are equal, round, and reactive to light.  Neck:     Thyroid : No thyromegaly.     Vascular: No JVD.  Cardiovascular:     Rate and Rhythm: Normal rate and regular rhythm.     Heart sounds: Normal heart sounds. No murmur heard.    No friction  rub. No gallop.  Pulmonary:     Effort: Pulmonary effort is normal. No respiratory distress.     Breath sounds: Normal breath sounds. No wheezing or rales.  Abdominal:     General: Bowel sounds are normal. There is no distension.     Palpations: Abdomen is soft. There is no mass.     Tenderness: There is no abdominal tenderness.  Musculoskeletal:        General: No tenderness. Normal range of motion.     Cervical back: Normal range of motion and neck supple.     Right lower leg: No edema.     Left lower leg: No edema.  Lymphadenopathy:     Cervical: No cervical adenopathy.  Skin:    General: Skin is warm and dry.     Findings: No erythema or rash.  Neurological:     Mental Status: She is alert.     Coordination: Coordination normal.  Psychiatric:        Behavior: Behavior normal.     (all labs ordered are listed, but only abnormal results are displayed) Labs Reviewed  COMPREHENSIVE METABOLIC PANEL WITH GFR - Abnormal; Notable for the following components:      Result Value   Potassium 2.9 (*)    CO2 18 (*)    Glucose, Bld 178 (*)    Total Bilirubin 2.9 (*)    All other components within normal limits  CBC - Abnormal; Notable for the following components:   WBC 11.5 (*)    Platelets 136 (*)    All other components within normal limits  URINALYSIS, ROUTINE W REFLEX MICROSCOPIC - Abnormal; Notable for the following components:   APPearance CLOUDY (*)    Hgb urine dipstick MODERATE (*)    Ketones, ur 5 (*)    Protein, ur 100 (*)    Leukocytes,Ua MODERATE (*)    All other components within normal limits  URINE CULTURE  LIPASE, BLOOD    EKG: None  Radiology: CT Renal Stone Study Result Date: 11/23/2024 CLINICAL DATA:  Abdominal pain for 3 days. EXAM: CT ABDOMEN AND PELVIS WITHOUT CONTRAST TECHNIQUE: Multidetector CT imaging of the abdomen and pelvis was performed following the standard protocol without IV contrast. RADIATION DOSE REDUCTION: This exam was performed  according to the departmental dose-optimization program which includes automated exposure control, adjustment of the mA and/or kV according to patient size and/or use of iterative reconstruction technique. COMPARISON:  11/07/2023. FINDINGS: Lower chest: Mild atelectasis in the right lower lobe and posterior right middle lobe above an elevated right hemidiaphragm, stable from the prior exam. Hepatobiliary: Normal liver. Status post cholecystectomy. No bile duct dilation Pancreas: No  mass or inflammation. Spleen: Normal in size without focal abnormality. Adrenals/Urinary Tract: No adrenal mass. Mild right hydroureteronephrosis with right perinephric stranding. No ureteral stone. No bladder stone. 2 mm nonobstructing stone in the lower pole the right kidney. Stable upper pole 2 cm cyst with no follow-up recommended. No left intrarenal stones. No left hydronephrosis. Stable anterior mid to upper pole 2.7 cm cyst. No follow-up recommended. Left ureter normal in course and in caliber. Bladder mildly distended, otherwise unremarkable. Stomach/Bowel: Normal stomach. Small bowel and colon are normal in caliber. No wall thickening. No inflammation. Numerous colonic diverticula. No diverticulitis. Vascular/Lymphatic: Aortic atherosclerosis. No aneurysm. No enlarged lymph nodes. Reproductive: Uterus and bilateral adnexa are unremarkable. Other: None. Musculoskeletal: No fracture or acute finding.  No bone lesion. IMPRESSION: 1. Mild right hydroureteronephrosis with right perinephric stranding. However, no visualized ureteral stone. There is a phlebolith at lies directly adjacent to the distal right ureter, present the prior CT. Findings may be due to a recently passed stone, a non radiopaque ureteral obstruction or pyelonephritis. 2. No other evidence of an acute abnormality within the abdomen or pelvis. 3. Colonic diverticulosis.  Aortic atherosclerosis. Electronically Signed   By: Alm Parkins M.D.   On: 11/23/2024 16:49      Procedures   Medications Ordered in the ED  ondansetron  (ZOFRAN ) injection 4 mg (has no administration in time range)  sodium chloride  0.9 % bolus 1,000 mL (1,000 mLs Intravenous New Bag/Given 11/23/24 1617)  sodium chloride  0.9 % bolus 1,000 mL (1,000 mLs Intravenous New Bag/Given 11/23/24 1615)  ondansetron  (ZOFRAN ) injection 4 mg (4 mg Intravenous Given 11/23/24 1617)                                    Medical Decision Making Amount and/or Complexity of Data Reviewed Labs: ordered. Radiology: ordered.  Risk Prescription drug management.   Other than the patient being slightly uncomfortable with nausea she does not have any tenderness in her belly.  She is mildly tachycardic, temperature 99.2.  She needs IV fluids and labs, would also repeat a CT scan to make sure there is still not a kidney stone which could indicate a septic stone.  The patient is agreeable to the plan  Labs:  I  personally viewed and interpreted the labs which show urinary tract infection likely with lots of leukocytes.  Minimal leukocytosis, preserved renal function    Radiology Imaging: I personally viewed the images of the ordered radiographic studies and find CT scan shows that there is mild hydronephrosis on the right but no signs of obvious obstruction such as a kidney stone, this may have passed.  This actually correlates with the patient having had some right sided flank and abdominal pain which resolved within the last 24 hours. I agree with the radiologist interpretation as well   Meds / Interventions: while in the ED the patient received the following: IV fluids and antiemetics, the patient already been given antibiotics at the urgent care by injection The response to the interventions was that the patient improved, 2 L of IV fluid, tolerated p.o.  I have discussed with the patient at the bedside the results, and the meaning of these results.  They have had opportunity to ask questions,   expressed their understanding to the need for follow-up with primary care physician  No fever no vomiting and the tachycardia has resolved, the patient appears stable for discharge, she is  agreeable to the plan to return should symptoms worsen.  Urine culture has been sent      Final diagnoses:  Pyelonephritis    ED Discharge Orders          Ordered    cephALEXin (KEFLEX) 500 MG capsule  4 times daily        11/23/24 1807    ondansetron  (ZOFRAN ) 4 MG tablet  Every 6 hours        11/23/24 1807               Cleotilde Rogue, MD 11/23/24 [phone]

## 2024-11-23 NOTE — ED Notes (Addendum)
 Pt A&Ox4. Denies being nauseous at this time. Orange emesis is seen on pt's hands from previous episode.

## 2024-11-24 LAB — URINE CULTURE: Culture: NO GROWTH
